# Patient Record
Sex: Female | Born: 1945 | Race: White | Hispanic: No | Marital: Single | Smoking: Never smoker
Health system: Southern US, Community
[De-identification: ages and names within clinical notes are randomized; demographics above are authoritative.]

## PROBLEM LIST (undated history)

## (undated) DIAGNOSIS — H269 Unspecified cataract: Secondary | ICD-10-CM

## (undated) DIAGNOSIS — Z5189 Encounter for other specified aftercare: Secondary | ICD-10-CM

## (undated) DIAGNOSIS — I10 Essential (primary) hypertension: Secondary | ICD-10-CM

## (undated) DIAGNOSIS — F32A Depression, unspecified: Secondary | ICD-10-CM

## (undated) DIAGNOSIS — Z87442 Personal history of urinary calculi: Secondary | ICD-10-CM

## (undated) DIAGNOSIS — T7840XA Allergy, unspecified, initial encounter: Secondary | ICD-10-CM

## (undated) DIAGNOSIS — R011 Cardiac murmur, unspecified: Secondary | ICD-10-CM

## (undated) DIAGNOSIS — F419 Anxiety disorder, unspecified: Secondary | ICD-10-CM

## (undated) DIAGNOSIS — M1990 Unspecified osteoarthritis, unspecified site: Secondary | ICD-10-CM

## (undated) DIAGNOSIS — F329 Major depressive disorder, single episode, unspecified: Secondary | ICD-10-CM

## (undated) DIAGNOSIS — D649 Anemia, unspecified: Secondary | ICD-10-CM

## (undated) DIAGNOSIS — F0781 Postconcussional syndrome: Secondary | ICD-10-CM

## (undated) DIAGNOSIS — M810 Age-related osteoporosis without current pathological fracture: Secondary | ICD-10-CM

## (undated) DIAGNOSIS — J189 Pneumonia, unspecified organism: Secondary | ICD-10-CM

## (undated) DIAGNOSIS — K219 Gastro-esophageal reflux disease without esophagitis: Secondary | ICD-10-CM

## (undated) DIAGNOSIS — J45909 Unspecified asthma, uncomplicated: Secondary | ICD-10-CM

## (undated) HISTORY — DX: Age-related osteoporosis without current pathological fracture: M81.0

## (undated) HISTORY — DX: Major depressive disorder, single episode, unspecified: F32.9

## (undated) HISTORY — DX: Unspecified cataract: H26.9

## (undated) HISTORY — DX: Encounter for other specified aftercare: Z51.89

## (undated) HISTORY — DX: Postconcussional syndrome: F07.81

## (undated) HISTORY — DX: Cardiac murmur, unspecified: R01.1

## (undated) HISTORY — DX: Unspecified asthma, uncomplicated: J45.909

## (undated) HISTORY — DX: Gastro-esophageal reflux disease without esophagitis: K21.9

## (undated) HISTORY — DX: Allergy, unspecified, initial encounter: T78.40XA

## (undated) HISTORY — DX: Unspecified osteoarthritis, unspecified site: M19.90

## (undated) HISTORY — PX: REPLACEMENT TOTAL KNEE BILATERAL: SUR1225

## (undated) HISTORY — DX: Anxiety disorder, unspecified: F41.9

## (undated) HISTORY — DX: Depression, unspecified: F32.A

## (undated) HISTORY — PX: COLONOSCOPY: SHX174

## (undated) HISTORY — DX: Essential (primary) hypertension: I10

---

## 1984-01-31 HISTORY — PX: ABDOMINAL HYSTERECTOMY: SHX81

## 1999-10-15 ENCOUNTER — Encounter: Admission: RE | Admit: 1999-10-15 | Discharge: 1999-10-15 | Payer: Self-pay | Admitting: Orthopedic Surgery

## 1999-10-18 ENCOUNTER — Encounter: Admission: RE | Admit: 1999-10-18 | Discharge: 1999-10-18 | Payer: Self-pay | Admitting: Orthopedic Surgery

## 1999-10-18 ENCOUNTER — Encounter: Payer: Self-pay | Admitting: Orthopedic Surgery

## 1999-10-20 ENCOUNTER — Encounter: Admission: RE | Admit: 1999-10-20 | Discharge: 1999-10-20 | Payer: Self-pay | Admitting: Internal Medicine

## 1999-10-20 ENCOUNTER — Encounter: Payer: Self-pay | Admitting: Internal Medicine

## 2001-11-06 ENCOUNTER — Encounter: Admission: RE | Admit: 2001-11-06 | Discharge: 2001-11-06 | Payer: Self-pay | Admitting: Internal Medicine

## 2001-11-06 ENCOUNTER — Encounter: Payer: Self-pay | Admitting: Internal Medicine

## 2003-02-12 ENCOUNTER — Encounter: Admission: RE | Admit: 2003-02-12 | Discharge: 2003-02-12 | Payer: Self-pay | Admitting: Internal Medicine

## 2003-11-16 ENCOUNTER — Encounter: Admission: RE | Admit: 2003-11-16 | Discharge: 2003-11-16 | Payer: Self-pay | Admitting: Internal Medicine

## 2004-01-12 ENCOUNTER — Encounter: Admission: RE | Admit: 2004-01-12 | Discharge: 2004-01-12 | Payer: Self-pay | Admitting: Internal Medicine

## 2004-12-02 DIAGNOSIS — C439 Malignant melanoma of skin, unspecified: Secondary | ICD-10-CM

## 2004-12-02 HISTORY — DX: Malignant melanoma of skin, unspecified: C43.9

## 2004-12-15 ENCOUNTER — Encounter: Admission: RE | Admit: 2004-12-15 | Discharge: 2004-12-15 | Payer: Self-pay | Admitting: Internal Medicine

## 2005-10-31 DIAGNOSIS — D229 Melanocytic nevi, unspecified: Secondary | ICD-10-CM

## 2005-10-31 HISTORY — DX: Melanocytic nevi, unspecified: D22.9

## 2006-05-30 ENCOUNTER — Encounter: Admission: RE | Admit: 2006-05-30 | Discharge: 2006-05-30 | Payer: Self-pay | Admitting: Internal Medicine

## 2007-01-31 HISTORY — PX: MELANOMA EXCISION: SHX5266

## 2007-11-26 ENCOUNTER — Encounter: Admission: RE | Admit: 2007-11-26 | Discharge: 2007-11-26 | Payer: Self-pay | Admitting: Internal Medicine

## 2009-01-05 ENCOUNTER — Encounter: Admission: RE | Admit: 2009-01-05 | Discharge: 2009-01-05 | Payer: Self-pay | Admitting: Internal Medicine

## 2009-12-02 ENCOUNTER — Encounter: Payer: Self-pay | Admitting: Gastroenterology

## 2010-01-30 DIAGNOSIS — Z5189 Encounter for other specified aftercare: Secondary | ICD-10-CM

## 2010-01-30 HISTORY — DX: Encounter for other specified aftercare: Z51.89

## 2010-02-01 ENCOUNTER — Encounter
Admission: RE | Admit: 2010-02-01 | Discharge: 2010-02-01 | Payer: Self-pay | Source: Home / Self Care | Attending: Internal Medicine | Admitting: Internal Medicine

## 2010-03-01 NOTE — Letter (Signed)
Summary: Colonoscopy Letter  [facility]  [address] [place], [place] [address]   Phone: [phone]  Fax: [phone]      December 02, 2009 MRN: [id number]   [name] [address] [place], [place]  [address]   Dear [name],   According to your medical record, it is time for you to schedule a Colonoscopy. The American Cancer Society recommends this procedure as a method to detect early colon cancer. Patients with a family history of colon cancer, or a personal history of colon polyps or inflammatory bowel disease are at increased risk.  This letter has been generated based on the recommendations made at the time of your procedure. If you feel that in your particular situation this may no longer apply, please contact our office.  Please call our office at [phone] to schedule this appointment or to update your records at your earliest convenience.  Thank you for cooperating with [place] to provide you with the very best care possible.   Sincerely,  Barbette Hair. Arlyce Dice, M.D.  [facility] [phone]

## 2010-05-03 ENCOUNTER — Other Ambulatory Visit (HOSPITAL_COMMUNITY): Payer: Self-pay | Admitting: Orthopedic Surgery

## 2010-05-03 ENCOUNTER — Encounter (HOSPITAL_COMMUNITY)
Admission: RE | Admit: 2010-05-03 | Discharge: 2010-05-03 | Disposition: A | Discharge status: Home / Self Care | Payer: BC Managed Care – PPO | Source: Ambulatory Visit | Attending: Orthopedic Surgery | Admitting: Orthopedic Surgery

## 2010-05-03 ENCOUNTER — Ambulatory Visit (HOSPITAL_COMMUNITY)
Admission: RE | Admit: 2010-05-03 | Discharge: 2010-05-03 | Disposition: A | Discharge status: Home / Self Care | Payer: BC Managed Care – PPO | Source: Ambulatory Visit | Attending: Orthopedic Surgery | Admitting: Orthopedic Surgery

## 2010-05-03 DIAGNOSIS — IMO0002 Reserved for concepts with insufficient information to code with codable children: Secondary | ICD-10-CM | POA: Insufficient documentation

## 2010-05-03 DIAGNOSIS — Z01812 Encounter for preprocedural laboratory examination: Secondary | ICD-10-CM | POA: Insufficient documentation

## 2010-05-03 DIAGNOSIS — K449 Diaphragmatic hernia without obstruction or gangrene: Secondary | ICD-10-CM | POA: Insufficient documentation

## 2010-05-03 DIAGNOSIS — Z01818 Encounter for other preprocedural examination: Secondary | ICD-10-CM | POA: Insufficient documentation

## 2010-05-03 DIAGNOSIS — M1710 Unilateral primary osteoarthritis, unspecified knee: Secondary | ICD-10-CM | POA: Insufficient documentation

## 2010-05-03 DIAGNOSIS — Z01810 Encounter for preprocedural cardiovascular examination: Secondary | ICD-10-CM | POA: Insufficient documentation

## 2010-05-03 LAB — CBC
HCT: 36.2 % (ref 36.0–46.0)
Hemoglobin: 11.7 g/dL — ABNORMAL LOW (ref 12.0–15.0)
MCH: 28.4 pg (ref 26.0–34.0)
MCHC: 32.3 g/dL (ref 30.0–36.0)
MCV: 87.9 fL (ref 78.0–100.0)
Platelets: 287 10*3/uL (ref 150–400)
RBC: 4.12 MIL/uL (ref 3.87–5.11)
RDW: 14.2 % (ref 11.5–15.5)
WBC: 12 10*3/uL — ABNORMAL HIGH (ref 4.0–10.5)

## 2010-05-03 LAB — APTT: aPTT: 32 seconds (ref 24–37)

## 2010-05-03 LAB — URINE MICROSCOPIC-ADD ON

## 2010-05-03 LAB — BASIC METABOLIC PANEL
BUN: 20 mg/dL (ref 6–23)
CO2: 28 mEq/L (ref 19–32)
Calcium: 9.6 mg/dL (ref 8.4–10.5)
Creatinine, Ser: 0.85 mg/dL (ref 0.4–1.2)
GFR calc non Af Amer: >60 mL/min (ref >60)
Glucose, Bld: 96 mg/dL (ref 70–99)
Potassium: 4.4 mEq/L (ref 3.5–5.1)
Sodium: 140 mEq/L (ref 135–145)

## 2010-05-03 LAB — URINALYSIS, ROUTINE W REFLEX MICROSCOPIC
Bilirubin Urine: NEGATIVE
Glucose, UA: NEGATIVE mg/dL
Hgb urine dipstick: NEGATIVE
Ketones, ur: NEGATIVE mg/dL
Nitrite: NEGATIVE
Specific Gravity, Urine: 1.025 (ref 1.005–1.030)
pH: 5.5 (ref 5.0–8.0)

## 2010-05-03 LAB — SURGICAL PCR SCREEN
MRSA, PCR: NEGATIVE
Staphylococcus aureus: POSITIVE — AB

## 2010-05-03 LAB — PROTIME-INR: INR: 1.01 (ref 0.00–1.49)

## 2010-05-11 ENCOUNTER — Inpatient Hospital Stay (HOSPITAL_COMMUNITY): Payer: BC Managed Care – PPO

## 2010-05-11 ENCOUNTER — Inpatient Hospital Stay (HOSPITAL_COMMUNITY)
Admission: RE | Admit: 2010-05-11 | Discharge: 2010-05-14 | DRG: 209 | Disposition: A | Discharge status: Skilled Nursing Facility | Payer: BC Managed Care – PPO | Source: Ambulatory Visit | Attending: Orthopedic Surgery | Admitting: Orthopedic Surgery

## 2010-05-11 DIAGNOSIS — M1710 Unilateral primary osteoarthritis, unspecified knee: Principal | ICD-10-CM | POA: Diagnosis present

## 2010-05-11 DIAGNOSIS — K219 Gastro-esophageal reflux disease without esophagitis: Secondary | ICD-10-CM | POA: Diagnosis present

## 2010-05-11 DIAGNOSIS — F329 Major depressive disorder, single episode, unspecified: Secondary | ICD-10-CM | POA: Diagnosis present

## 2010-05-11 DIAGNOSIS — F3289 Other specified depressive episodes: Secondary | ICD-10-CM | POA: Diagnosis present

## 2010-05-11 DIAGNOSIS — Z85820 Personal history of malignant melanoma of skin: Secondary | ICD-10-CM

## 2010-05-11 DIAGNOSIS — I10 Essential (primary) hypertension: Secondary | ICD-10-CM | POA: Diagnosis present

## 2010-05-11 DIAGNOSIS — J45909 Unspecified asthma, uncomplicated: Secondary | ICD-10-CM | POA: Diagnosis present

## 2010-05-11 DIAGNOSIS — D649 Anemia, unspecified: Secondary | ICD-10-CM | POA: Diagnosis present

## 2010-05-11 DIAGNOSIS — E669 Obesity, unspecified: Secondary | ICD-10-CM | POA: Diagnosis present

## 2010-05-11 LAB — ABO/RH: ABO/RH(D): A POS

## 2010-05-12 LAB — BASIC METABOLIC PANEL
BUN: 10 mg/dL (ref 6–23)
CO2: 28 mEq/L (ref 19–32)
Calcium: 8.2 mg/dL — ABNORMAL LOW (ref 8.4–10.5)
Chloride: 106 mEq/L (ref 96–112)
Creatinine, Ser: 0.91 mg/dL (ref 0.4–1.2)
GFR calc Af Amer: >60 mL/min (ref >60)
Glucose, Bld: 123 mg/dL — ABNORMAL HIGH (ref 70–99)
Potassium: 5 mEq/L (ref 3.5–5.1)
Sodium: 138 mEq/L (ref 135–145)

## 2010-05-12 LAB — HEMOGLOBIN AND HEMATOCRIT, BLOOD
HCT: 26.6 % — ABNORMAL LOW (ref 36.0–46.0)
Hemoglobin: 8.5 g/dL — ABNORMAL LOW (ref 12.0–15.0)

## 2010-05-12 LAB — CBC
Hemoglobin: 8.9 g/dL — ABNORMAL LOW (ref 12.0–15.0)
MCH: 29.2 pg (ref 26.0–34.0)
MCHC: 32.6 g/dL (ref 30.0–36.0)
Platelets: 212 10*3/uL (ref 150–400)
RBC: 3.05 MIL/uL — ABNORMAL LOW (ref 3.87–5.11)
RDW: 14.5 % (ref 11.5–15.5)
WBC: 9.9 10*3/uL (ref 4.0–10.5)

## 2010-05-12 LAB — PROTIME-INR
INR: 1.18 (ref 0.00–1.49)
Prothrombin Time: 15.2 seconds (ref 11.6–15.2)

## 2010-05-13 ENCOUNTER — Inpatient Hospital Stay (HOSPITAL_COMMUNITY): Payer: BC Managed Care – PPO

## 2010-05-13 LAB — CBC
Hemoglobin: 8.1 g/dL — ABNORMAL LOW (ref 12.0–15.0)
MCHC: 32.4 g/dL (ref 30.0–36.0)
Platelets: 189 10*3/uL (ref 150–400)
RBC: 2.85 MIL/uL — ABNORMAL LOW (ref 3.87–5.11)
RDW: 14.4 % (ref 11.5–15.5)
WBC: 11.2 10*3/uL — ABNORMAL HIGH (ref 4.0–10.5)

## 2010-05-13 LAB — BASIC METABOLIC PANEL
BUN: 8 mg/dL (ref 6–23)
Calcium: 8 mg/dL — ABNORMAL LOW (ref 8.4–10.5)
Chloride: 103 mEq/L (ref 96–112)
Creatinine, Ser: 0.87 mg/dL (ref 0.4–1.2)
GFR calc Af Amer: >60 mL/min (ref >60)
GFR calc non Af Amer: >60 mL/min (ref >60)
Potassium: 4 mEq/L (ref 3.5–5.1)
Sodium: 135 mEq/L (ref 135–145)

## 2010-05-13 LAB — HEMOGLOBIN AND HEMATOCRIT, BLOOD
HCT: 24.8 % — ABNORMAL LOW (ref 36.0–46.0)
Hemoglobin: 8 g/dL — ABNORMAL LOW (ref 12.0–15.0)

## 2010-05-13 LAB — PROTIME-INR
INR: 2.34 — ABNORMAL HIGH (ref 0.00–1.49)
Prothrombin Time: 25.8 seconds — ABNORMAL HIGH (ref 11.6–15.2)

## 2010-05-13 LAB — PREPARE RBC (CROSSMATCH)

## 2010-05-14 DIAGNOSIS — M79609 Pain in unspecified limb: Secondary | ICD-10-CM

## 2010-05-14 LAB — BASIC METABOLIC PANEL
BUN: 9 mg/dL (ref 6–23)
Creatinine, Ser: 0.81 mg/dL (ref 0.4–1.2)
GFR calc non Af Amer: >60 mL/min (ref >60)
Glucose, Bld: 115 mg/dL — ABNORMAL HIGH (ref 70–99)
Potassium: 4.1 mEq/L (ref 3.5–5.1)

## 2010-05-14 LAB — CBC
Hemoglobin: 10.3 g/dL — ABNORMAL LOW (ref 12.0–15.0)
MCH: 28.1 pg (ref 26.0–34.0)
MCHC: 33.1 g/dL (ref 30.0–36.0)
Platelets: 213 10*3/uL (ref 150–400)
RDW: 14.7 % (ref 11.5–15.5)

## 2010-05-14 LAB — TYPE AND SCREEN
ABO/RH(D): A POS
Unit division: 0
Unit division: 0

## 2010-05-14 LAB — PROTIME-INR
INR: 2.69 — ABNORMAL HIGH (ref 0.00–1.49)
Prothrombin Time: 28.7 seconds — ABNORMAL HIGH (ref 11.6–15.2)

## 2010-06-07 NOTE — Discharge Summary (Signed)
[name], [name] NO.:  [id number]  MEDICAL RECORD NO.:  [id number]           PATIENT TYPE:  I  LOCATION:  5024                         FACILITY:  [facility]  PHYSICIAN:  Loreta Ave, M.D. DATE OF BIRTH:  [birth year]  DATE OF ADMISSION:  05/11/2010 DATE OF DISCHARGE:  05/14/2010                              DISCHARGE SUMMARY   FINAL DIAGNOSES: 1. 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 2. Hypertension. 3. Gastroesophageal reflux disease. 4. Anemia. 5. Depression. 6. Asthma.  HISTORY OF PRESENT ILLNESS:  An 65 year old white female with history of end-stage DJD, left knee and chronic pain presented to our office for preop evaluation for total knee replacement.  She had progressively worsening pain, which failed to respond with conservative treatment. Significant decreased in her daily activities due to the ongoing complaint.  HOSPITAL COURSE:  On May 11, 2010, the patient was taken to the [facility] OR and a left total knee replacement procedure performed.  SURGEON:  Loreta Ave, MD  ASSISTANT:  Genene Churn. Barry Dienes, PA-C  ANESTHESIA:  General.  Femoral nerve block.  EBL:  Minimal.  TOURNIQUET TIME:  71 minutes.  One Hemovac drain used.  There were no surgical or anesthesia complications and the patient was transferred to recovery in stable condition.  After the patient was transferred to the orthopedic floor, pharmacy protocol of Coumadin and Lovenox started for DVT prophylaxis. On May 12, 2010, the patient is doing well with good pain control. Hemoglobin 8.9, hematocrit 27.3, WBC 9.9, platelets 212.  Sodium 138, potassium 4.0, chloride 106, CO2 of 28, BUN 10, creatinine 0.91, glucose 123.  INR 1.12.  Dressing clean, dry, and intact.  Calf nontender neurovascularly.  Skin warm and dry.  Discontinue the PCA.  Arranging transfer to [facility] for rehab.  Encourage incentive spirometry. Recheck  H and H.  On May 13, 2010, the patient doing well again with good pain control left knee.  Hemoglobin 8.1, hematocrit 25.0.  INR 2.34.  The wound looks good and staples intact.  No drainage or sign of infection.  Bilateral calves some tenderness.  Hemovac drain removed. Rechecked hemoglobin later in the afternoon and this was 8.  Would type and cross and transfused 2 units of packed red blood cells.  Awaiting transfer to [facility] Saturday morning.  DISCHARGE MEDICATIONS: 1. Percocet 7.5/325 one to two tablets p.o. q.4-6 h. p.r.n. for pain. 2. Robaxin 500 mg 1 tablet p.o. q.6 h. p.r.n. spasms. 3. Coumadin pharmacy protocol.  Maintain INR 2-3 x4 weeks, postop DVT     prophylaxis. 4. Iron tablet, 1 tablet daily. 5. Flonase nasal spray one spray each nostril daily as needed. 6. Albuterol inhaler 1 puff daily as needed. 7. Serevent inhaler 1 puff daily as needed. 8. EpiPen subcu one injection daily p.r.n. 9. Xanax 0.25 mg p.o. 1 tablet q.8 h. p.r.n. anxiety. 10.Omeprazole 20 mg 1 tablet p.o. daily. 11.Fexofenadine 180 mg 1 tablet p.o. daily. 12.Fluoxetine 40 mg 1 tablet p.o. daily. 13.Doxazosin 8 mg 1 tablet p.o. daily 14.Norvasc 10 mg 1 tablet p.o. daily 15.Atenolol 100 mg 1  tablet p.o. daily.  CONDITION:  Good and stable.  DISPOSITION:  Transferred to [facility] for rehab.  INSTRUCTIONS:  While at [facility], the patient will continue to work with PT and OT to improve ambulation and knee range of motion and strengthening.  She is weightbearing as tolerated.  CPM 0 to 75 degrees and increase by 5-10 degrees daily as tolerated.  She is okay to shower, but no tub soaking.  Daily dressing changes with 4x4 gauze and can pull TED hose over this.  Coumadin x4 weeks postop for DVT prophylaxis. Maintain INR 2-3.  Needs return office visit with Dr. Eulah Pont when she is 2 weeks postop for recheck and we will remove staples at that time. Notify us immediately if there are any questions or  concerns.     Genene Churn. Denton Meek.   ______________________________ Loreta Ave, M.D.    JMO/MEDQ  D:  05/13/2010  T:  05/13/2010  Job:  [id number]  Electronically Signed by Zonia Kief P.A. on 05/18/2010 03:34:11 PM Electronically Signed by Mckinley Jewel M.D. on 06/07/2010 01:29:55 PM

## 2010-06-07 NOTE — Op Note (Signed)
[name], [name] NO.:  [id number]  MEDICAL RECORD NO.:  [id number]           PATIENT TYPE:  I  LOCATION:  5024                         FACILITY:  [facility]  PHYSICIAN:  Loreta Ave, M.D. DATE OF BIRTH:  27-Jul-1945  DATE OF PROCEDURE:  05/11/2010 DATE OF DISCHARGE:                              OPERATIVE REPORT   PREOPERATIVE DIAGNOSIS:  Left knee end-stage degenerative arthritis, varus alignment.  POSTOPERATIVE DIAGNOSIS:  Left knee end-stage degenerative arthritis, varus alignment.  PROCEDURE:  Left modified minimally invasive total knee replacement Stryker triathlon prosthesis.  Soft tissue balancing.  Cemented pegged posterior stabilized #3 femoral component.  Cemented #4 tibial component.  9-mm polyethylene insert.  Cemented resurfacing 32-mm patellar component.  SURGEON:  Loreta Ave, MD  ASSISTANT:  Zonia Kief, PA, present throughout the entire case and necessary for timely completion of procedure.  ANESTHESIA:  General.  ESTIMATED BLOOD LOSS:  Minimal.  SPECIMENS:  None.  COUNTS:  None.  COMPLICATIONS:  None.  DRESSING:  Soft compressive, knee immobilizer.  DRAINS:  Hemovac x1.  TOURNIQUET TIME:  1 hour.  PROCEDURE IN DETAIL:  The patient was brought to the operating room and placed on the operating table in supine position.  After adequate anesthesia had been obtained, left knee examined.  Mild flexion contracture, varus alignment partially correctable flexion 90 degrees. Tourniquet applied.  Prepped and draped in usual sterile fashion. Exsanguinated with elevation, Esmarch and tourniquet inflated at 350 mmHg.  Straight incision above the patella down to the tibial tubercle. Medial arthrotomy vastus splitting preserving quad tendon.  Medial capsule released.  Knee exposed.  Grade 4 changes throughout.  Remnants of menisci, cruciate ligaments, periarticular spurs, and loose bodies removed.  Distal femur exposed.   Intramedullary guide placed.  10-mm resection set at 5 degrees of valgus.  Using epicondylar axis, the femur was sized, cut, and fitted for posterior stabilized pegged #3 femoral component.  Proximal tibial resection.  Extramedullary guide.  3-degree posterior slope cut.  Size #4 component.  All recesses explored.  All loose bodies, fragments, and spurs removed.  Copious irrigation. Patella exposed.  Posterior 10 mm removed.  Drilled, sized, and fitted for a 32-mm component.  Trials put in place throughout.  #3 above, #4 below, and a 9-mm insert.  A 32-mm component on the patella.  With this construct, full extension, full flexion, good alignment, good stability, and good mechanical axis.  Tibia was marked for rotation and hand reamed.  All trials have been removed.  Copious irrigation with a pulse irrigating device.  Cement prepared and placed on all components which were firmly seated.  Polyethylene attached to tibia and knee reduced. Patella held with clamp.  After the cement hardened, the knee was reexamined.  Again, pleased with alignment, stability, mechanical axis, and tracking.  Hemovac was placed through a separate stab wound.  Wound irrigated.  Arthrotomy closed with #1 Vicryl.  Skin and subcutaneous tissue with Vicryl and staples.  Hemovac clamp.  Sterile compressive dressing applied.  Tourniquet deflated and removed.  Knee immobilizer applied.  Anesthesia reversed.  Brought to recovery room.  Tolerated the surgery well.  No  complications.     Loreta Ave, M.D.     DFM/MEDQ  D:  05/11/2010  T:  05/12/2010  Job:  [id number]  Electronically Signed by Mckinley Jewel M.D. on 06/07/2010 01:29:59 PM

## 2010-06-10 ENCOUNTER — Encounter (HOSPITAL_COMMUNITY)
Admission: RE | Admit: 2010-06-10 | Discharge: 2010-06-10 | Disposition: A | Discharge status: Home / Self Care | Payer: BC Managed Care – PPO | Source: Ambulatory Visit | Attending: Orthopedic Surgery | Admitting: Orthopedic Surgery

## 2010-06-10 LAB — CBC
HCT: 41.7 % (ref 36.0–46.0)
MCHC: 31.7 g/dL (ref 30.0–36.0)
MCV: 88 fL (ref 78.0–100.0)
Platelets: 307 10*3/uL (ref 150–400)
RDW: 13.8 % (ref 11.5–15.5)

## 2010-06-10 LAB — COMPREHENSIVE METABOLIC PANEL
ALT: 17 U/L (ref 0–35)
AST: 22 U/L (ref 0–37)
Albumin: 3.8 g/dL (ref 3.5–5.2)
Calcium: 10.6 mg/dL — ABNORMAL HIGH (ref 8.4–10.5)
GFR calc Af Amer: >60 mL/min (ref >60)
Sodium: 141 mEq/L (ref 135–145)
Total Protein: 7.6 g/dL (ref 6.0–8.3)

## 2010-06-10 LAB — URINE MICROSCOPIC-ADD ON

## 2010-06-10 LAB — URINALYSIS, ROUTINE W REFLEX MICROSCOPIC
Bilirubin Urine: NEGATIVE
Hgb urine dipstick: NEGATIVE
Ketones, ur: 15 mg/dL — AB
Specific Gravity, Urine: 1.026 (ref 1.005–1.030)
pH: 5 (ref 5.0–8.0)

## 2010-06-10 LAB — PROTIME-INR: INR: 1.64 — ABNORMAL HIGH (ref 0.00–1.49)

## 2010-06-22 ENCOUNTER — Inpatient Hospital Stay (HOSPITAL_COMMUNITY): Payer: BC Managed Care – PPO

## 2010-06-22 ENCOUNTER — Inpatient Hospital Stay (HOSPITAL_COMMUNITY)
Admission: RE | Admit: 2010-06-22 | Discharge: 2010-06-25 | DRG: 209 | Disposition: A | Discharge status: Skilled Nursing Facility | Payer: BC Managed Care – PPO | Source: Ambulatory Visit | Attending: Orthopedic Surgery | Admitting: Orthopedic Surgery

## 2010-06-22 DIAGNOSIS — F329 Major depressive disorder, single episode, unspecified: Secondary | ICD-10-CM | POA: Diagnosis present

## 2010-06-22 DIAGNOSIS — Z85820 Personal history of malignant melanoma of skin: Secondary | ICD-10-CM

## 2010-06-22 DIAGNOSIS — K219 Gastro-esophageal reflux disease without esophagitis: Secondary | ICD-10-CM | POA: Diagnosis present

## 2010-06-22 DIAGNOSIS — F3289 Other specified depressive episodes: Secondary | ICD-10-CM | POA: Diagnosis present

## 2010-06-22 DIAGNOSIS — I10 Essential (primary) hypertension: Secondary | ICD-10-CM | POA: Diagnosis present

## 2010-06-22 DIAGNOSIS — D62 Acute posthemorrhagic anemia: Secondary | ICD-10-CM | POA: Diagnosis not present

## 2010-06-22 DIAGNOSIS — J45909 Unspecified asthma, uncomplicated: Secondary | ICD-10-CM | POA: Diagnosis present

## 2010-06-22 DIAGNOSIS — G8929 Other chronic pain: Secondary | ICD-10-CM | POA: Diagnosis present

## 2010-06-22 DIAGNOSIS — M1710 Unilateral primary osteoarthritis, unspecified knee: Principal | ICD-10-CM | POA: Diagnosis present

## 2010-06-22 LAB — URINALYSIS, ROUTINE W REFLEX MICROSCOPIC
Ketones, ur: 15 mg/dL — AB
Nitrite: NEGATIVE
Specific Gravity, Urine: 1.026 (ref 1.005–1.030)
pH: 5.5 (ref 5.0–8.0)

## 2010-06-22 LAB — PROTIME-INR: INR: 1 (ref 0.00–1.49)

## 2010-06-22 LAB — URINE MICROSCOPIC-ADD ON

## 2010-06-23 LAB — PROTIME-INR: INR: 1.3 (ref 0.00–1.49)

## 2010-06-23 LAB — CBC
HCT: 26.8 % — ABNORMAL LOW (ref 36.0–46.0)
Platelets: 242 10*3/uL (ref 150–400)
RDW: 14.1 % (ref 11.5–15.5)
WBC: 11.8 10*3/uL — ABNORMAL HIGH (ref 4.0–10.5)

## 2010-06-23 LAB — BASIC METABOLIC PANEL
Calcium: 8.8 mg/dL (ref 8.4–10.5)
Creatinine, Ser: 0.66 mg/dL (ref 0.4–1.2)
GFR calc Af Amer: >60 mL/min (ref >60)
GFR calc non Af Amer: >60 mL/min (ref >60)

## 2010-06-23 LAB — URINE CULTURE

## 2010-06-24 LAB — CBC
HCT: 24.8 % — ABNORMAL LOW (ref 36.0–46.0)
Hemoglobin: 8 g/dL — ABNORMAL LOW (ref 12.0–15.0)
MCH: 28.4 pg (ref 26.0–34.0)
MCHC: 32.3 g/dL (ref 30.0–36.0)

## 2010-06-24 LAB — BASIC METABOLIC PANEL
BUN: 9 mg/dL (ref 6–23)
Chloride: 105 mEq/L (ref 96–112)
Glucose, Bld: 132 mg/dL — ABNORMAL HIGH (ref 70–99)
Potassium: 4.1 mEq/L (ref 3.5–5.1)

## 2010-06-25 LAB — BASIC METABOLIC PANEL
BUN: 9 mg/dL (ref 6–23)
CO2: 28 mEq/L (ref 19–32)
Chloride: 104 mEq/L (ref 96–112)
Glucose, Bld: 108 mg/dL — ABNORMAL HIGH (ref 70–99)
Potassium: 4.8 mEq/L (ref 3.5–5.1)

## 2010-06-25 LAB — TYPE AND SCREEN
ABO/RH(D): A POS
Unit division: 0

## 2010-06-25 LAB — CBC
HCT: 30.2 % — ABNORMAL LOW (ref 36.0–46.0)
Hemoglobin: 9.9 g/dL — ABNORMAL LOW (ref 12.0–15.0)
MCH: 28.9 pg (ref 26.0–34.0)
MCHC: 32.8 g/dL (ref 30.0–36.0)
MCV: 88.3 fL (ref 78.0–100.0)

## 2010-06-26 NOTE — Op Note (Signed)
NAMECARYSSA, [name] NO.:  [id number]  MEDICAL RECORD NO.:  [id number]           PATIENT TYPE:  I  LOCATION:  5003                         FACILITY:  [facility]  PHYSICIAN:  Loreta Ave, M.D. DATE OF BIRTH:  1945-10-14  DATE OF PROCEDURE:  06/22/2010 DATE OF DISCHARGE:                              OPERATIVE REPORT   PREOPERATIVE DIAGNOSIS:  End-stage degenerative arthritis, right knee, varus alignment.  POSTOPERATIVE DIAGNOSIS:  End-stage degenerative arthritis, right knee, varus alignment.  PROCEDURE:  Modified minimally invasive right total knee replacement with Stryker triathlon prosthesis.  Soft tissue balancing.  Cemented pegged posterior stabilized #3 femoral component.  Cemented #4 tibial component 9-mm polyethylene insert.  Cemented 32-mm patellar component.  SURGEON:  Loreta Ave, MD  ASSISTANT:  Zonia Kief, PA, present throughout the entire case and necessary for timely completion of procedure.  ANESTHESIA:  General.  ESTIMATED BLOOD LOSS:  Minimal.  SPECIMENS:  None.  COUNTS:  None.  COMPLICATIONS:  None.  DRESSING:  Soft compressive.  TOURNIQUET TIME:  1 hour.  PROCEDURE IN DETAIL:  The patient was brought to the operating room and placed on the operating table in supine position.  After adequate anesthesia had been obtained, knee examined.  Varus alignment just prior to correctable neutral.  Motion 0-100.  Tourniquet applied.  Prepped and draped in the usual sterile fashion.  Exsanguinated with elevation of Esmarch.  Tourniquet inflated to 350 mmHg.  Anterior incision above the patella down to tibial tubercle.  Medial arthrotomy vastus splitting. Knee exposed.  Grade 4 change throughout most marked medially.  Medial capsule release.  Remnants of menisci, cruciate ligament spurs, and loose bodies removed.  Intramedullary guide on the femur.  10-mm resection set at 5 degrees of valgus.  The femur was sized, cut, and fitted  for a posterior stabilized #3 component using epicondylar axis. Extramedullary guide on the tibia, 3-degree posterior slope cut.  Sized to #4 component.  After irrigating out the entire knee and checking all recess to be sure all debris was cleared out, trials put in place.  #3 on the femur, #4 on the tibia.  9-mm insert.  Patella exposed, posterior 10 mm removed.  Drilled, sized, and fitted for a 32-mm component.  With all these trials in place, I was very pleased with mechanical axis, alignment, stability, and patellofemoral tracking.  Tibia was marked for rotation and hand reamed.  All trials had been removed.  Copious irrigation with pulse irrigating device.  Cement prepared and placed on all components which were firmly seated.  Polyethylene attached to tibia and knee reduced.  Patella held with a clamp.  Once the cement hardened, I again noted motion, stability, and tracking and was pleased.  Wound irrigated.  Hemovac placed and brought through a separate stab wound. Arthrotomy closed with #1 Vicryl.  Skin and subcutaneous tissue with Vicryl and staples.  Sterile compressive dressing applied.  Tourniquet deflated and removed.  Knee immobilizer applied.  Anesthesia reversed. Brought to recovery room.  Tolerated surgery well.  No complications.     Loreta Ave, M.D.     DFM/MEDQ  D:  06/22/2010  T:  06/23/2010  Job:  [id number]  Electronically Signed by Mckinley Jewel M.D. on 06/26/2010 11:38:23 AM

## 2010-07-21 NOTE — Discharge Summary (Signed)
[name], [name] NO.:  [id number]  MEDICAL RECORD NO.:  [id number]           PATIENT TYPE:  I  LOCATION:  5003                         FACILITY:  [facility]  PHYSICIAN:  Loreta Ave, M.D. DATE OF BIRTH:  08-Aug-1945  DATE OF ADMISSION:  06/22/2010 DATE OF DISCHARGE:  06/24/2010                              DISCHARGE SUMMARY   FINAL DIAGNOSES: 1. Status post right total knee replacement for end-stage degenerative     joint disease. 2. Anemia. 3. Hypertension. 4. Gastroesophageal reflux disease. 5. Depression. 6. Asthma.  HISTORY OF PRESENT ILLNESS:  A 65 year old white female, history of end- stage DJD, right knee and chronic pain, presented to our office for preoperative evaluation for total knee replacement.  She had progressive worsening pain with failure to response with conservative treatment. Significant decrease in her daily activities due to the ongoing complaint.HOSPITAL COURSE:  On Jun 22, 2010, the patient was taken to the [facility] OR and a right total knee replacement procedure was performed. Surgeon Mckinley Jewel, MD and assistant Zonia Kief, PA-C.  Anesthesia general.  Minimal blood loss.  No specimens.  Tourniquet time 1 hour. There were no surgical or anesthesia complications.  The patient was transferred to recovery in stable condition.  On Jun 23, 2010, the patient was doing well.  Good pain control.  Pharmacy protocol Coumadin, Lovenox started for DVT prophylaxis.  Temperature 98.3, pulse 69, respirations 18, blood pressure 93/45.  WBC 11.8, hemoglobin 8.8, hematocrit 26.8, platelets 242.  Sodium 136, potassium 4.1, chloride 102, CO2 of 27, glucose 116, BUN 12, creatinine 0.66, INR 1.30.  Urine culture showed no growth.  PT/OT consults.  Awaiting placement at [facility] for rehab.  On Jun 24, 2010, the patient is doing well.  He has complained of right knee pain.  Requesting stronger pain meds. Temperature 98.2, pulse 79,  respirations 18, blood pressure 125/48.  WBC 10.3, hemoglobin 8.0, hematocrit 24.8, platelets 211.  Sodium 137, potassium 4.1, chloride 105, CO2 of 25, glucose 132, BUN 9, creatinine 0.67, INR 1.44.  Right knee wound looks good and staples intact.  No drainage or sign of infection.  Hemovac drain removed.  Calf nontender and neurovascularly intact.  Skin warm and dry.  We will transfuse 2 units of packed red blood cells for acute blood loss anemia.  Anticipate transfer to [facility] Saturday morning.  DISCHARGE MEDICATIONS: 1. Percocet 10/325 one to two tablets daily q.6 h. p.r.n. for pain. 2. Robaxin 500 mg 1 tablet p.o. q.6 h. p.r.n. for spasms. 3. Coumadin pharmacy protocol.  Maintain INR 2-3 times 4 weeks postop     for DVT prophylaxis. 4. Lovenox 30 mg 1 subcu injection q.12 h. and discontinue when     Coumadin is therapeutic. 5. Flonase nasal spray 1 spray each nostril daily as needed. 6. Albuterol inhaler 1 puff daily as needed. 7. Serevent inhaler 1 puff daily as needed. 8. EpiPen subcu 1 injection daily p.r.n. 9. Xanax 0.25 mg 1 tablet p.o. q.8 h. p.r.n. for anxiety. 10.Omeprazole 20 mg 1 tablet p.o. daily. 11.Fexofenadine 180 mg 1 tablet p.o. daily. 12.Fluoxetine 40 mg 1 tablet p.o. daily.  13.Doxazosin 8 mg 1 tablet p.o. daily. 14.Norvasc 10 mg 1 tablet p.o. daily. 15.Atenolol 100 mg 1 tablet p.o. daily.  CONDITION:  Good and stable.  DISPOSITION:  Transfer to [facility] for rehab.  INSTRUCTIONS:  While at [facility], the patient will continue to work with PT and OT to improve ambulation and knee range of motion and strengthening.  Weightbear as tolerated.  Coumadin x4 weeks postop for DVT prophylaxis.  Discontinue Lovenox when the Coumadin is therapeutic with INR 2-3.  Daily dressing changes with 4 x 4 gauze and apply a TED hose over this.  Knee staples to be removed 2 weeks postop in our office.  The patient okay to shower, but no tub soaking.  Do not  apply any creams or ointments to her incision.  Follow up with Dr. Eulah Pont when she is 2 weeks postop for recheck.  Call us immediately if there are any questions or concerns.     Genene Churn. Denton Meek.   ______________________________ Loreta Ave, M.D.    JMO/MEDQ  D:  06/24/2010  T:  06/24/2010  Job:  [id number]  Electronically Signed by Zonia Kief P.A. on 06/29/2010 03:22:45 PM Electronically Signed by Mckinley Jewel M.D. on 07/21/2010 04:05:55 PM

## 2010-07-23 ENCOUNTER — Inpatient Hospital Stay (HOSPITAL_COMMUNITY)
Admission: RE | Admit: 2010-07-23 | Discharge: 2010-07-25 | DRG: 430 | Disposition: A | Discharge status: Home / Self Care | Payer: BC Managed Care – PPO | Source: Ambulatory Visit | Attending: Psychiatry | Admitting: Psychiatry

## 2010-07-23 ENCOUNTER — Emergency Department (HOSPITAL_COMMUNITY)
Admission: EM | Admit: 2010-07-23 | Discharge: 2010-07-23 | Disposition: A | Discharge status: Other Acute Inpatient Hospital | Payer: BC Managed Care – PPO | Attending: Emergency Medicine | Admitting: Emergency Medicine

## 2010-07-23 DIAGNOSIS — Z7901 Long term (current) use of anticoagulants: Secondary | ICD-10-CM

## 2010-07-23 DIAGNOSIS — F341 Dysthymic disorder: Secondary | ICD-10-CM | POA: Insufficient documentation

## 2010-07-23 DIAGNOSIS — Z96659 Presence of unspecified artificial knee joint: Secondary | ICD-10-CM

## 2010-07-23 DIAGNOSIS — J45909 Unspecified asthma, uncomplicated: Secondary | ICD-10-CM

## 2010-07-23 DIAGNOSIS — I10 Essential (primary) hypertension: Secondary | ICD-10-CM

## 2010-07-23 DIAGNOSIS — Z79899 Other long term (current) drug therapy: Secondary | ICD-10-CM | POA: Insufficient documentation

## 2010-07-23 DIAGNOSIS — R45851 Suicidal ideations: Secondary | ICD-10-CM

## 2010-07-23 DIAGNOSIS — R110 Nausea: Secondary | ICD-10-CM | POA: Insufficient documentation

## 2010-07-23 DIAGNOSIS — R002 Palpitations: Secondary | ICD-10-CM | POA: Insufficient documentation

## 2010-07-23 DIAGNOSIS — R4589 Other symptoms and signs involving emotional state: Secondary | ICD-10-CM | POA: Insufficient documentation

## 2010-07-23 DIAGNOSIS — D649 Anemia, unspecified: Secondary | ICD-10-CM

## 2010-07-23 DIAGNOSIS — F332 Major depressive disorder, recurrent severe without psychotic features: Principal | ICD-10-CM

## 2010-07-23 DIAGNOSIS — M25569 Pain in unspecified knee: Secondary | ICD-10-CM | POA: Insufficient documentation

## 2010-07-23 DIAGNOSIS — K219 Gastro-esophageal reflux disease without esophagitis: Secondary | ICD-10-CM

## 2010-07-23 LAB — POCT I-STAT, CHEM 8
BUN: 17 mg/dL (ref 6–23)
Calcium, Ion: 1.2 mmol/L (ref 1.12–1.32)
Creatinine, Ser: 0.7 mg/dL (ref 0.50–1.10)
Sodium: 140 mEq/L (ref 135–145)
TCO2: 23 mmol/L (ref 0–100)

## 2010-07-23 LAB — DIFFERENTIAL
Basophils Relative: 0 % (ref 0–1)
Monocytes Absolute: 1 10*3/uL (ref 0.1–1.0)
Monocytes Relative: 10 % (ref 3–12)
Neutro Abs: 6.2 10*3/uL (ref 1.7–7.7)

## 2010-07-23 LAB — URINALYSIS, ROUTINE W REFLEX MICROSCOPIC
Glucose, UA: NEGATIVE mg/dL
Ketones, ur: 15 mg/dL — AB
Specific Gravity, Urine: 1.024 (ref 1.005–1.030)
pH: 5.5 (ref 5.0–8.0)

## 2010-07-23 LAB — RAPID URINE DRUG SCREEN, HOSP PERFORMED
Cocaine: NOT DETECTED
Opiates: POSITIVE — AB

## 2010-07-23 LAB — URINE MICROSCOPIC-ADD ON

## 2010-07-23 LAB — ETHANOL: Alcohol, Ethyl (B): <11 mg/dL (ref 0–11)

## 2010-07-23 LAB — CBC
Hemoglobin: 12.4 g/dL (ref 12.0–15.0)
MCH: 28.7 pg (ref 26.0–34.0)
MCHC: 33 g/dL (ref 30.0–36.0)

## 2010-07-24 DIAGNOSIS — F329 Major depressive disorder, single episode, unspecified: Secondary | ICD-10-CM

## 2010-07-26 NOTE — Discharge Summary (Signed)
[name], [name] NO.:  [id number]  MEDICAL RECORD NO.:  [id number]  LOCATION:  1610                          FACILITY:  [facility]  PHYSICIAN:  Debbora Lacrosse, MD       DATE OF BIRTH:  May 06, 1945  DATE OF ADMISSION:  07/23/2010 DATE OF DISCHARGE:  07/25/2010                              DISCHARGE SUMMARY   REASON FOR ADMISSION:  This is a 65 year old divorced white female who had a difficult time with significant stressors recently.  She has had significant depression and anxiety, and stated that she just wants to sleep.  She has recently had both knees replaced and had a difficult time with the second one in May.  She also, just last week, was notified that her brother had died and this was just after having a significant stroke.  She stated initially she wanted to drink a bottle of wine and slam her head into a wall, but she stated she never really wanted to end her life.  She has been seeing her primary care physician, Dr. Chilton Si, for depression.  She has not been eating or sleeping well recently.  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 AXIS II:  None. AXIS III: 1. Status post bilateral knee replacements. 2. History of anemia. 3. History of hypertension. 4. History of gastroesophageal reflux disease. 5. Asthma. AXIS IV:  Problems with support, social environment and bereavement. AXIS V:  50.  PERTINENT LABORATORY DATA:  Urine drug screen positive for benzodiazepines and opiates, as expected from her medications.  PTT 25, PT 17.3.  Alcohol less than 11.  CBC normal.  STAT Chem-8 normal, except the glucose was 158.  HOSPITAL COURSE:  The patient was admitted on July 23, 2010, and was not suicidal at that time.  She has been dealing with significant stressors as noted in the history and has returned to what she believes is her usual baseline.  She did have some changes in medication, including  a change from Xanax to Klonopin, which she tolerated well and she also had addition of Remeron 15 mg q.h.s., which she reports has been helpful for her sleep and appetite.  She feels more energized, more focused and more hopeful.  She has a supportive family and her daughter is a Visual merchandiser, who also believes that the patient is safe and stable. The patient is not suicidal or homicidal and no psychotic symptoms.  The patient is safe for discharge.  The patient participated well in the inpatient unit in individual and group treatment.  She continued on her nonpsychiatric medications and will continue those at discharge.  DISCHARGE MEDICATIONS: 1. Atenolol 100 mg daily. 2. Norvasc 10 mg daily. 3. Cardura 8 mg daily. 4. Prozac 40 mg daily. 5. Claritin 10 mg daily. 6. Protonix 40 mg daily. 7. Serevent one puff daily. 8. Remeron 15 mg q.h.s. 9. Klonopin 0.25 mg q.i.d. 10.Cipro 250 mg b.i.d. until July 26, 2010.  DISCHARGE FOLLOWUP:  The patient will continue to follow up with her primary care physician who has been managing her psychiatric and nonpsychiatric medications.  She has a support team with her family and if  further therapy or treatment options are required, those can be arranged.  CONDITION ON DISCHARGE:  The patient is stable, not dangerous, not suicidal or homicidal and is able to safely be maintained at home on her own.          ______________________________ Debbora Lacrosse, MD     WS/MEDQ  D:  07/25/2010  T:  07/25/2010  Job:  [id number]  Electronically Signed by Andi Devon Victorino Fatzinger  on 07/26/2010 08:14:32 AM

## 2010-07-26 NOTE — H&P (Signed)
[name], [name] NO.:  [id number]  MEDICAL RECORD NO.:  [id number]  LOCATION:  0307                          FACILITY:  [facility]  PHYSICIAN:  Erica Lacrosse, MD       DATE OF BIRTH:  [birth year]  DATE OF ADMISSION:  07/23/2010 DATE OF DISCHARGE:                      PSYCHIATRIC ADMISSION ASSESSMENT   This is a psychiatric admission assessment regarding the patient, [name].  This is a voluntary admission to the services of Dr. Judithann Patel.  Today's date is July 24, 2010.  This is a 65 year old divorced white female.  Apparently she "lost it" in [name]'s yesterday.  She stated "I've gone through so much depression and anxiety all I want to do is sleep".  She has had both knees replaced, the first one was April 11, the second was May 23 and she just finished with home health care on Friday.  She was notified that her brother died earlier in the day, 2022/07/01, and she "lost it".  She reported suicidal ideation of either wanting to drink a bottle of wine or slam her head into a wall.  She said "I'm tired of feeling like this; all I wanted to do was get my knee surgery and be able to get around again; there is too much going on in my family and I need to be here for them".  Apparently she has been treated for many years on the outside by her primary care physician, Dr. Nila Patel, for depression.  She was the older sister of the brother who died.  Apparently he had quite an alcohol issue.  He had a stroke. He was not found for several days.  He was put on hospice and he did pass yesterday.  The patient states that she did go to the health food store to try to find something to sleep.  She has not been sleeping well and her appetite is off.  She threatened to slam her head through the wall yesterday to get the "pain" to stop.  This would be the chest pain and headache that she had after hearing that her brother had actually passed.  PAST PSYCHIATRIC HISTORY:   She does not have a formal history.  SOCIAL HISTORY:  She finished high school.  She has been married and divorced once.  She has a daughter 32 who is a Child psychotherapist, a daughter 68 and a son 17.  She is employed at [employer].  She is a Merchandiser, retail of juvenile records.  FAMILY HISTORY:  Her mother age 58 still lives at home but apparently recently severely burned her hand.  It was thought she might need skin grafts but she does not, then her brother just died.  ALCOHOL AND DRUG HISTORY:  Apparently there is a heavy family history for alcoholism.  MEDICAL PRIMARY CARE:  Dr. Nila Patel.  She in fact has an upcoming appointment on Tuesday.  She does see a therapist, Dr. Andi Patel.  MEDICAL PROBLEMS:  She is known to have hypertension, GERD, asthma and depression.  She is status post knee replacement, one knee April 11, the other May 23.  MEDICATIONS:  As per her discharge summary on May 25. 1. She is on Percocet  10/325 one q.6 h p.r.n. pain. 2. Robaxin 500 mg 1 tablet p.o. q.6 h p.r.n. for spasms. 3. Coumadin maintain her INR 2-3 times for 4 weeks postop for DVT     prophylaxis. 4. Lovenox 30 mg one subcu injection q.12 h and discontinue when     Coumadin is therapeutic. 5. Flonase nasal spray 1 spray each nostril daily as needed. 6. Albuterol inhaler 1 puff daily as needed. 7. Serevent inhaler 1 puff daily as needed. 8. EpiPen subcu 1 injection daily p.r.n. 9. Xanax 0.25 mg 1 tablet p.o. q.8 h p.r.n. anxiety. 10.Omeprazole 20 mg 1 tablet p.o. daily. 11.Fexofenadine 180 mg tablet 1 p.o. daily. 12.Fluoxetine 40 mg p.o. daily. 13.Doxazosin 8 mg tablet 1 tablet p.o. daily. 14.Norvasc 10 mg tablet 1 p.o. daily. 15.Atenolol 100 mg tablet 1 p.o. daily.  She does in fact get her medications at [facility].  I did not call to verify these medications, I am just taking it from the discharge summary.  DRUG ALLERGIES:  No known drug allergies.  POSITIVE PHYSICAL FINDINGS:  She does have  incisions on both knees indicative of bilateral knee replacements.  She does not have swelling or redness.  She is using ice however.  In the emergency room her vital signs were stable.  She was afebrile 97.9, pulse was 79, respirations 18, blood pressure was 100/83.  UDS was positive for opiates and benzodiazepines but she is prescribed.  Her urinalysis had a moderate amount of leukocyte esterase.  She apparently does have frequent UTIs. We will treat that.  No other physical findings.  MENTAL STATUS EXAM:  Today she is alert and oriented.  She is casually groomed, dressed and nourished.  She is using a cane and ice packs on her knees to ambulate.  Her speech is not pressured.  Her mood is less distressed apparently than yesterday.  She is not actively suicidal or homicidal.  She is not having auditory or visual hallucinations. Judgment and insight are intact.  She stated that she thought she was okay and was going to make it to her appointment with Dr. Chilton Si, however, the news of her brother's death tipped her over the edge. Concentration and memory are intact.  Intelligence is at least average.  DIAGNOSES:  AXIS I:  Major depressive disorder recurrent severe without psychotic features.  She reports an increase in depression since the second surgery May 23. AXIS II:  None. 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  History for anemia, hypertension, gastroesophageal reflux disease and asthma. AXIS IV:  Grief. AXIS V:  20.  PLAN:  To admit for safety and stabilization.  The patient was asking if there was something that could be given for her anxiety that did not have such an abrupt on and off experience as the Xanax and I told her we could change it to Klonopin.  She is also not sleeping as well as she might and her appetite is off so will give her Remeron 15 mg at h.s. She can also be considered for  the intensive outpatient program. Estimated length of stay is just 2-3 days.     Mickie Leonarda Salon, P.A.-C.   ______________________________ Erica Lacrosse, MD    MD/MEDQ  D:  07/24/2010  T:  07/24/2010  Job:  [id number]  Electronically Signed by Jaci Lazier ADAMS P.A.-C. on 07/25/2010 06:16:43 PM Electronically Signed by Erica Devon Khaliq Turay  on 07/26/2010 08:14:20 AM

## 2011-02-02 ENCOUNTER — Other Ambulatory Visit: Payer: Self-pay | Admitting: Internal Medicine

## 2011-02-02 DIAGNOSIS — Z1231 Encounter for screening mammogram for malignant neoplasm of breast: Secondary | ICD-10-CM

## 2011-02-16 ENCOUNTER — Ambulatory Visit
Admission: RE | Admit: 2011-02-16 | Discharge: 2011-02-16 | Disposition: A | Discharge status: Home / Self Care | Payer: BC Managed Care – PPO | Source: Ambulatory Visit | Attending: Internal Medicine | Admitting: Internal Medicine

## 2011-02-16 DIAGNOSIS — Z1231 Encounter for screening mammogram for malignant neoplasm of breast: Secondary | ICD-10-CM

## 2011-10-03 ENCOUNTER — Encounter: Payer: Self-pay | Admitting: Gastroenterology

## 2011-12-04 ENCOUNTER — Encounter: Payer: Self-pay | Admitting: Gastroenterology

## 2011-12-14 ENCOUNTER — Ambulatory Visit (AMBULATORY_SURGERY_CENTER): Payer: BC Managed Care – PPO | Admitting: *Deleted

## 2011-12-14 VITALS — Ht 64.0 in | Wt 240.0 lb

## 2011-12-14 DIAGNOSIS — Z1211 Encounter for screening for malignant neoplasm of colon: Secondary | ICD-10-CM

## 2011-12-14 MED ORDER — NA SULFATE-K SULFATE-MG SULF 17.5-3.13-1.6 GM/177ML PO SOLN: ORAL | Status: DC

## 2011-12-14 MED ORDER — SUPREP BOWEL PREP KIT 17.5-3.13-1.6 GM/177ML PO SOLN: ORAL | Status: DC

## 2012-01-05 ENCOUNTER — Encounter: Payer: Self-pay | Admitting: *Deleted

## 2012-01-05 ENCOUNTER — Encounter: Payer: Self-pay | Admitting: Gastroenterology

## 2012-01-05 ENCOUNTER — Ambulatory Visit (AMBULATORY_SURGERY_CENTER): Payer: BC Managed Care – PPO | Admitting: Gastroenterology

## 2012-01-05 VITALS — BP 142/84 | HR 56 | Temp 99.4°F | Resp 16 | Ht 64.0 in | Wt 240.0 lb

## 2012-01-05 DIAGNOSIS — Z1211 Encounter for screening for malignant neoplasm of colon: Secondary | ICD-10-CM

## 2012-01-05 DIAGNOSIS — K5730 Diverticulosis of large intestine without perforation or abscess without bleeding: Secondary | ICD-10-CM

## 2012-01-05 MED ORDER — SODIUM CHLORIDE 0.9 % IV SOLN: 500.0000 mL | INTRAVENOUS | Status: DC

## 2012-01-05 NOTE — Patient Instructions (Addendum)
YOU HAD AN ENDOSCOPIC PROCEDURE TODAY AT THE [facility]: Refer to the procedure report that was given to you for any specific questions about what was found during the examination.  If the procedure report does not answer your questions, please call your gastroenterologist to clarify.  If you requested that your care partner not be given the details of your procedure findings, then the procedure report has been included in a sealed envelope for you to review at your convenience later.  YOU SHOULD EXPECT: Some feelings of bloating in the abdomen. Passage of more gas than usual.  Walking can help get rid of the air that was put into your GI tract during the procedure and reduce the bloating. If you had a lower endoscopy (such as a colonoscopy or flexible sigmoidoscopy) you may notice spotting of blood in your stool or on the toilet paper. If you underwent a bowel prep for your procedure, then you may not have a normal bowel movement for a few days.  DIET: Your first meal following the procedure should be a light meal and then it is ok to progress to your normal diet.  A half-sandwich or bowl of soup is an example of a good first meal.  Heavy or fried foods are harder to digest and may make you feel nauseous or bloated.  Likewise meals heavy in dairy and vegetables can cause extra gas to form and this can also increase the bloating.  Drink plenty of fluids but you should avoid alcoholic beverages for 24 hours.  ACTIVITY: Your care partner should take you home directly after the procedure.  You should plan to take it easy, moving slowly for the rest of the day.  You can resume normal activity the day after the procedure however you should NOT DRIVE or use heavy machinery for 24 hours (because of the sedation medicines used during the test).    SYMPTOMS TO REPORT IMMEDIATELY: A gastroenterologist can be reached at any hour.  During normal business hours, 8:30 AM to 5:00 PM Monday through Friday,  call [phone].  After hours and on weekends, please call the GI answering service at [phone] who will take a message and have the physician on call contact you.   Following lower endoscopy (colonoscopy or flexible sigmoidoscopy):  Excessive amounts of blood in the stool  Significant tenderness or worsening of abdominal pains  Swelling of the abdomen that is new, acute  Fever of 100F or higher   FOLLOW UP: If any biopsies were taken you will be contacted by phone or by letter within the next 1-3 weeks.  Call your gastroenterologist if you have not heard about the biopsies in 3 weeks.  Our staff will call the home number listed on your records the next business day following your procedure to check on you and address any questions or concerns that you may have at that time regarding the information given to you following your procedure. This is a courtesy call and so if there is no answer at the home number and we have not heard from you through the emergency physician on call, we will assume that you have returned to your regular daily activities without incident.  SIGNATURES/CONFIDENTIALITY: You and/or your care partner have signed paperwork which will be entered into your electronic medical record.  These signatures attest to the fact that that the information above on your After Visit Summary has been reviewed and is understood.  Full responsibility of the confidentiality of   this discharge information lies with you and/or your care-partner.   Resume medications. Information on diverticulosis and high fiber diet given with discharge instructions. 

## 2012-01-05 NOTE — Progress Notes (Signed)
Patient did not experience any of the following events: a burn prior to discharge; a fall within the facility; wrong site/side/patient/procedure/implant event; or a hospital transfer or hospital admission upon discharge from the facility. (G8907) Patient did not have preoperative order for IV antibiotic SSI prophylaxis. (G8918)  

## 2012-01-05 NOTE — Op Note (Signed)
[facility] [address] [place], [address]   COLONOSCOPY PROCEDURE REPORT  PATIENT: [name], [name]  MR#: [id number] BIRTHDATE: 12/05/1945 , 65  yrs. old GENDER: Female ENDOSCOPIST: Louis Meckel, MD REFERRED UU:[id number] Chilton Si, M.D. PROCEDURE DATE:  01/05/2012 PROCEDURE:   Colonoscopy, diagnostic ASA CLASS:   Class II INDICATIONS:average risk screening. MEDICATIONS: MAC sedation, administered by CRNA and propofol (Diprivan) 200mg  IV  DESCRIPTION OF PROCEDURE:   After the risks benefits and alternatives of the procedure were thoroughly explained, informed consent was obtained.  A digital rectal exam revealed no abnormalities of the rectum.   The LB [id number]-[id number]  endoscope was introduced through the anus and advanced to the cecum, which was identified by both the appendix and ileocecal valve. No adverse events experienced.   The quality of the prep was Suprep excellent The instrument was then slowly withdrawn as the colon was fully examined.      COLON FINDINGS: Mild diverticulosis was noted in the sigmoid colon. The colon mucosa was otherwise normal.  Retroflexed views revealed no abnormalities. The time to cecum=2 minutes 23 seconds. Withdrawal time=7 minutes 59 seconds.  The scope was withdrawn and the procedure completed. COMPLICATIONS: There were no complications.  ENDOSCOPIC IMPRESSION: 1.   Mild diverticulosis was noted in the sigmoid colon 2.   The colon mucosa was otherwise normal  RECOMMENDATIONS: Continue current colorectal screening recommendations for "routine risk" patients with a repeat colonoscopy in 10 years.   eSigned:  Louis Meckel, MD 01/05/2012 11:35 AM   cc:

## 2012-01-08 ENCOUNTER — Telehealth: Payer: Self-pay | Admitting: *Deleted

## 2012-01-08 NOTE — Telephone Encounter (Signed)
  Follow up Call-  Call back number 01/05/2012  Post procedure Call Back phone  # [phone]  Permission to leave phone message Yes     Patient questions:  Do you have a fever, pain , or abdominal swelling? no Pain Score  0 *  Have you tolerated food without any problems? yes  Have you been able to return to your normal activities? yes  Do you have any questions about your discharge instructions: Diet   no Medications  no Follow up visit  no  Do you have questions or concerns about your Care? no  Actions: * If pain score is 4 or above: No action needed, pain <4.

## 2012-01-16 ENCOUNTER — Other Ambulatory Visit: Payer: Self-pay | Admitting: Internal Medicine

## 2012-01-16 DIAGNOSIS — Z1231 Encounter for screening mammogram for malignant neoplasm of breast: Secondary | ICD-10-CM

## 2012-02-20 ENCOUNTER — Ambulatory Visit
Admission: RE | Admit: 2012-02-20 | Discharge: 2012-02-20 | Disposition: A | Discharge status: Home / Self Care | Payer: BC Managed Care – PPO | Source: Ambulatory Visit | Attending: Internal Medicine | Admitting: Internal Medicine

## 2012-02-20 DIAGNOSIS — Z1231 Encounter for screening mammogram for malignant neoplasm of breast: Secondary | ICD-10-CM

## 2012-03-06 ENCOUNTER — Ambulatory Visit (INDEPENDENT_AMBULATORY_CARE_PROVIDER_SITE_OTHER): Payer: BC Managed Care – PPO | Admitting: Emergency Medicine

## 2012-03-06 VITALS — BP 170/77 | HR 70 | Temp 99.7°F | Ht 64.0 in | Wt 220.0 lb

## 2012-03-06 DIAGNOSIS — J9801 Acute bronchospasm: Secondary | ICD-10-CM

## 2012-03-06 DIAGNOSIS — J111 Influenza due to unidentified influenza virus with other respiratory manifestations: Secondary | ICD-10-CM

## 2012-03-06 DIAGNOSIS — R05 Cough: Secondary | ICD-10-CM

## 2012-03-06 LAB — POCT INFLUENZA A/B
Influenza A, POC: NEGATIVE
Influenza B, POC: NEGATIVE

## 2012-03-06 MED ORDER — HYDROCOD POLST-CHLORPHEN POLST 10-8 MG/5ML PO LQCR: 5.0000 mL | Freq: Two times a day (BID) | ORAL | Status: DC | PRN

## 2012-03-06 MED ORDER — OSELTAMIVIR PHOSPHATE 75 MG PO CAPS: 75.0000 mg | ORAL_CAPSULE | Freq: Two times a day (BID) | ORAL | Status: DC

## 2012-03-06 MED ORDER — ALBUTEROL SULFATE HFA 108 (90 BASE) MCG/ACT IN AERS: 2.0000 | INHALATION_SPRAY | RESPIRATORY_TRACT | Status: DC | PRN

## 2012-03-06 NOTE — Patient Instructions (Signed)

## 2012-03-06 NOTE — Progress Notes (Signed)
[facility] [address] [place] [address] [phone]  Date:  03/06/2012   Name:  [name]   DOB:  03-27-1945   MRN:  [id number]  PCP:  Enrique Sack, MD    Chief Complaint: Cough, Nasal Congestion, Nausea and Headache   History of Present Illness:  [name] is a 67 y.o. very pleasant female patient who presents with the following:  Suddenly ill yesterday afternoon with nonproductive cough and fever. Has myalgias and arthralgias and malaise. No nausea or vomiting.  Wheezing but not using inhaler.  No  shortness of breath.  Has a headache and nasal congestion and post nasal drainage. No sore throat.  No improvement with OTC medications.  Had a flu shot this year  There is no problem list on file for this patient.   Past Medical History  Diagnosis Date  . Allergy     Hay fever/bee stings  . Anxiety   . Arthritis   . Asthma   . Blood transfusion without reported diagnosis 2012  . Cataract   . Depression   . GERD (gastroesophageal reflux disease)   . Heart murmur   . Hypertension     Past Surgical History  Procedure Date  . Melanoma excision 2009    left shoulder near neck  . Abdominal hysterectomy 1986  . Replacement total knee bilateral 05/2010 &05/2010    History  Substance Use Topics  . Smoking status: Never Smoker   . Smokeless tobacco: Never Used  . Alcohol Use: No    No family history on file.  No Known Allergies  Medication list has been reviewed and updated.  Current Outpatient Prescriptions on File Prior to Visit  Medication Sig Dispense Refill  . albuterol (PROVENTIL HFA;VENTOLIN HFA) 108 (90 BASE) MCG/ACT inhaler Inhale 2 puffs into the lungs every 6 (six) hours as needed.      Marland Kitchen atenolol (TENORMIN) 100 MG tablet Take 1 tablet by mouth Daily.      . clobetasol cream (TEMOVATE) 0.05 % Apply 1 application topically as needed. psoriasis      . clonazePAM (KLONOPIN) 0.5 MG tablet Take 0.5 mg by mouth  before cath procedure. anxiety      . diclofenac sodium (VOLTAREN) 1 % GEL Apply 1 application topically daily.      Marland Kitchen doxazosin (CARDURA) 8 MG tablet Take 1 tablet by mouth Daily.      Marland Kitchen EPINEPHrine (EPIPEN JR) 0.15 MG/0.3ML injection Inject 0.15 mg into the muscle as needed.      . fexofenadine (ALLEGRA) 180 MG tablet Take 180 mg by mouth daily.      . fluconazole (DIFLUCAN) 10 MG/ML suspension Take by mouth as directed. For fungal infection of nails      . FLUoxetine (PROZAC) 40 MG capsule Take 1 tablet by mouth Daily.      . fluticasone (FLONASE) 50 MCG/ACT nasal spray Place 2 sprays into the nose daily.      . hydrOXYzine (ATARAX/VISTARIL) 50 MG tablet Take 1 tablet by mouth At bedtime as needed. For sleep      . losartan (COZAAR) 100 MG tablet Take 50 mg by mouth Daily.      . mirtazapine (REMERON) 15 MG tablet Take 1 tablet by mouth Daily. Monday -Friday only  Gradually tapering dose      . Omeprazole 20 MG TBEC Take 1 tablet by mouth Daily.      . salmeterol (SEREVENT) 50 MCG/DOSE diskus inhaler  Inhale 1 puff into the lungs daily.      . traMADol-acetaminophen (ULTRACET) 37.5-325 MG per tablet Take 2 tablets by mouth as needed. pain      . meloxicam (MOBIC) 15 MG tablet Take 1 tablet by mouth Daily.        Review of Systems:  As per HPI, otherwise negative.    Physical Examination: Filed Vitals:   03/06/12 1223  BP: 170/77  Pulse: 70  Temp: 99.7 F (37.6 C)   Filed Vitals:   03/06/12 1223  Height: 5\' 4"  (1.626 m)  Weight: 220 lb (99.791 kg)   Body mass index is 37.76 kg/(m^2). Ideal Body Weight: Weight in (lb) to have BMI = 25: 145.3   GEN: WDWN, NAD, Non-toxic, A & O x 3 HEENT: Atraumatic, Normocephalic. Neck supple. No masses, No LAD. Ears and Nose: No external deformity. CV: RRR, No M/G/R. No JVD. No thrill. No extra heart sounds. PULM: CTA B, diffuse wheezes, no crackles, rhonchi. No retractions. No resp. distress. No accessory muscle use. ABD: S, NT, ND, +BS.  No rebound. No HSM. EXTR: No c/c/e NEURO Normal gait.  PSYCH: Normally interactive. Conversant. Not depressed or anxious appearing.  Calm demeanor.    Assessment and Plan: Influenza Bronchospasm neb  Carmelina Dane, MD  Results for orders placed in visit on 03/06/12  POCT INFLUENZA A/B      Component Value Range   Influenza A, POC Negative     Influenza B, POC Negative

## 2012-03-11 ENCOUNTER — Telehealth: Payer: Self-pay

## 2012-03-11 NOTE — Telephone Encounter (Signed)
PT STATES SHE WAS SEEN FOR THE FLU AND IS STILL HURTING. IS THAT NORMAL. PLEASE CALL [phone]

## 2012-03-11 NOTE — Telephone Encounter (Signed)
She feels weak. She is advised this is expected after the flu. Her fever has gone away now, but the fatigue continues. She is asking for a work note for today, I have provided this for her. She is advised if her cough becomes more productive or her fever returns , she should return to clinic.

## 2013-01-01 ENCOUNTER — Ambulatory Visit
Admission: RE | Admit: 2013-01-01 | Discharge: 2013-01-01 | Disposition: A | Discharge status: Home / Self Care | Payer: BC Managed Care – PPO | Source: Ambulatory Visit | Attending: Internal Medicine | Admitting: Internal Medicine

## 2013-01-01 ENCOUNTER — Other Ambulatory Visit: Payer: Self-pay | Admitting: Internal Medicine

## 2013-01-01 DIAGNOSIS — R062 Wheezing: Secondary | ICD-10-CM

## 2013-01-01 DIAGNOSIS — R05 Cough: Secondary | ICD-10-CM

## 2013-01-28 ENCOUNTER — Other Ambulatory Visit: Payer: Self-pay | Admitting: Internal Medicine

## 2013-01-28 ENCOUNTER — Ambulatory Visit
Admission: RE | Admit: 2013-01-28 | Discharge: 2013-01-28 | Disposition: A | Discharge status: Home / Self Care | Payer: BC Managed Care – PPO | Source: Ambulatory Visit | Attending: Internal Medicine | Admitting: Internal Medicine

## 2013-01-28 DIAGNOSIS — R05 Cough: Secondary | ICD-10-CM

## 2013-01-28 DIAGNOSIS — R062 Wheezing: Secondary | ICD-10-CM

## 2013-02-07 ENCOUNTER — Other Ambulatory Visit: Payer: Self-pay

## 2013-02-07 DIAGNOSIS — Z1231 Encounter for screening mammogram for malignant neoplasm of breast: Secondary | ICD-10-CM

## 2013-03-04 ENCOUNTER — Ambulatory Visit
Admission: RE | Admit: 2013-03-04 | Discharge: 2013-03-04 | Disposition: A | Discharge status: Home / Self Care | Payer: BC Managed Care – PPO | Source: Ambulatory Visit

## 2013-03-04 DIAGNOSIS — Z1231 Encounter for screening mammogram for malignant neoplasm of breast: Secondary | ICD-10-CM

## 2013-04-18 ENCOUNTER — Ambulatory Visit (INDEPENDENT_AMBULATORY_CARE_PROVIDER_SITE_OTHER): Payer: BC Managed Care – PPO | Admitting: Family Medicine

## 2013-04-18 ENCOUNTER — Ambulatory Visit: Payer: BC Managed Care – PPO

## 2013-04-18 VITALS — BP 160/80 | HR 57 | Temp 97.7°F | Resp 16 | Ht 64.0 in | Wt 205.0 lb

## 2013-04-18 DIAGNOSIS — R059 Cough, unspecified: Secondary | ICD-10-CM

## 2013-04-18 DIAGNOSIS — J029 Acute pharyngitis, unspecified: Secondary | ICD-10-CM

## 2013-04-18 DIAGNOSIS — R05 Cough: Secondary | ICD-10-CM

## 2013-04-18 DIAGNOSIS — I10 Essential (primary) hypertension: Secondary | ICD-10-CM | POA: Insufficient documentation

## 2013-04-18 DIAGNOSIS — J45909 Unspecified asthma, uncomplicated: Secondary | ICD-10-CM | POA: Insufficient documentation

## 2013-04-18 DIAGNOSIS — J209 Acute bronchitis, unspecified: Secondary | ICD-10-CM

## 2013-04-18 LAB — POCT RAPID STREP A (OFFICE): RAPID STREP A SCREEN: NEGATIVE

## 2013-04-18 MED ORDER — CEFDINIR 300 MG PO CAPS: 300.0000 mg | ORAL_CAPSULE | Freq: Two times a day (BID) | ORAL | Status: DC

## 2013-04-18 NOTE — Progress Notes (Signed)
[facility] [address] [place] [address] [phone]  Date:  04/18/2013   Name:  [name]   DOB:  15-Nov-1945   MRN:  [id number]  PCP:  Criselda Peaches, MD    Chief Complaint: Cough and Sore Throat   History of Present Illness:  [name] is a 68 y.o. very pleasant female patient who presents with the following:  She is here today with illness.  She noted onset of a cough about one week ago.  She is losing her voice, and has noted a ST with "white spots" on her throat. She has noted the ST just today.    The cough can be productive of "gross" browning- greenish mucus.   She has not noted a fever.   No body aches or chills.   No GI symptoms.   She is still taking her BP medications- she has been taking mucinex D for her cough.    There are no active problems to display for this patient.   Past Medical History  Diagnosis Date  . Allergy     Hay fever/bee stings  . Anxiety   . Arthritis   . Asthma   . Blood transfusion without reported diagnosis 2012  . Cataract   . Depression   . GERD (gastroesophageal reflux disease)   . Heart murmur   . Hypertension     Past Surgical History  Procedure Laterality Date  . Melanoma excision  2009    left shoulder near neck  . Abdominal hysterectomy  1986  . Replacement total knee bilateral  05/2010 &05/2010    History  Substance Use Topics  . Smoking status: Never Smoker   . Smokeless tobacco: Never Used  . Alcohol Use: No    History reviewed. No pertinent family history.  No Known Allergies  Medication list has been reviewed and updated.  Current Outpatient Prescriptions on File Prior to Visit  Medication Sig Dispense Refill  . albuterol (PROVENTIL HFA;VENTOLIN HFA) 108 (90 BASE) MCG/ACT inhaler Inhale 2 puffs into the lungs every 6 (six) hours as needed.      Marland Kitchen albuterol (PROVENTIL HFA;VENTOLIN HFA) 108 (90 BASE) MCG/ACT inhaler Inhale 2 puffs into the lungs every 4  (four) hours as needed for wheezing (cough, shortness of breath or wheezing.).  1 Inhaler  1  . atenolol (TENORMIN) 100 MG tablet Take 1 tablet by mouth Daily.      . chlorpheniramine-HYDROcodone (TUSSIONEX PENNKINETIC ER) 10-8 MG/5ML LQCR Take 5 mLs by mouth every 12 (twelve) hours as needed (cough).  60 mL  0  . clobetasol cream (TEMOVATE) 1.54 % Apply 1 application topically as needed. psoriasis      . clonazePAM (KLONOPIN) 0.5 MG tablet Take 0.5 mg by mouth before cath procedure. anxiety      . diclofenac sodium (VOLTAREN) 1 % GEL Apply 1 application topically daily.      Marland Kitchen doxazosin (CARDURA) 8 MG tablet Take 1 tablet by mouth Daily.      Marland Kitchen EPINEPHrine (EPIPEN JR) 0.15 MG/0.3ML injection Inject 0.15 mg into the muscle as needed.      . fexofenadine (ALLEGRA) 180 MG tablet Take 180 mg by mouth daily.      . fluconazole (DIFLUCAN) 10 MG/ML suspension Take by mouth as directed. For fungal infection of nails      . FLUoxetine (PROZAC) 40 MG capsule Take 1 tablet by mouth Daily.      . fluticasone (FLONASE) 50  MCG/ACT nasal spray Place 2 sprays into the nose daily.      Marland Kitchen losartan (COZAAR) 100 MG tablet Take 50 mg by mouth Daily.      . mirtazapine (REMERON) 15 MG tablet Take 1 tablet by mouth Daily. Monday -Friday only  Gradually tapering dose      . Omeprazole 20 MG TBEC Take 1 tablet by mouth Daily.      . salmeterol (SEREVENT) 50 MCG/DOSE diskus inhaler Inhale 1 puff into the lungs daily.      . traMADol-acetaminophen (ULTRACET) 37.5-325 MG per tablet Take 2 tablets by mouth as needed. pain      . hydrOXYzine (ATARAX/VISTARIL) 50 MG tablet Take 1 tablet by mouth At bedtime as needed. For sleep       No current facility-administered medications on file prior to visit.    Review of Systems:  As per HPI- otherwise negative.  Physical Examination: Filed Vitals:   04/18/13 1819  BP: 180/78  Pulse: 57  Temp: 97.7 F (36.5 C)  Resp: 16   Filed Vitals:   04/18/13 1819  Height: 5\' 4"   (1.626 m)  Weight: 205 lb (92.987 kg)   Body mass index is 35.17 kg/(m^2). Ideal Body Weight: Weight in (lb) to have BMI = 25: 145.3  GEN: WDWN, NAD, Non-toxic, A & O x 3 HEENT: Atraumatic, Normocephalic. Neck supple. No masses, No LAD.  Bilateral TM wnl, oropharynx normal; enlarged but benign appearing tonsils with likely food in craters.  PEERL,EOMI.   Ears and Nose: No external deformity. CV: RRR, No M/G/R. No JVD. No thrill. No extra heart sounds. PULM: CTA B, no wheezes, crackles, rhonchi. No retractions. No resp. distress. No accessory muscle use. ABD: S, NT, ND, +BS. No rebound. No HSM. EXTR: No c/c/e NEURO Normal gait.  PSYCH: Normally interactive. Conversant. Not depressed or anxious appearing.  Calm demeanor.   [facility] reading (PRIMARY) by  Dr. Lorelei Pont. CXR: no acute infiltrate, stable from last CXR  CHEST 2 VIEW  COMPARISON: January 28, 2013.  FINDINGS: Stable cardiomediastinal silhouette. Large hiatal hernia is stable as well. No pleural effusion or pneumothorax is noted. No acute pulmonary disease is noted. Bony thorax is intact.  IMPRESSION: Stable hiatal hernia. No acute cardiopulmonary abnormality seen.   Results for orders placed in visit on 04/18/13  POCT RAPID STREP A (OFFICE)      Result Value Ref Range   Rapid Strep A Screen Negative  Negative   Assessment and Plan: Acute pharyngitis - Plan: POCT rapid strep A, Culture, Group A Strep, cefdinir (OMNICEF) 300 MG capsule  Cough - Plan: DG Chest 2 View, cefdinir (OMNICEF) 300 MG capsule  Acute bronchitis - Plan: cefdinir (OMNICEF) 300 MG capsule  Await throat culture but doubt strep.  Treat with omnicef for bronchitis.   Elevated BP.  She denies any CP or headache.  Encouraged to d/c decongestant use as this may be raising her BP.   Signed Lamar Blinks, MD

## 2013-04-18 NOTE — Patient Instructions (Signed)
Let me know if you do not feel better in the next few days- Sooner if worse.   Avoid medications with decongestants- these can raise your BP

## 2013-04-21 LAB — CULTURE, GROUP A STREP: ORGANISM ID, BACTERIA: NORMAL

## 2013-04-23 ENCOUNTER — Telehealth: Payer: Self-pay

## 2013-04-23 MED ORDER — AZITHROMYCIN 250 MG PO TABS: ORAL_TABLET | ORAL | Status: DC

## 2013-04-23 NOTE — Telephone Encounter (Signed)
Patient is having diarrhea from the antibiotics - can she get something else called into [facility] on [address].   Patient completed forms on Friday to have medical release to Dr. Levin Erp for her OV for this date.  Please send OV notes over.    [phone]

## 2013-04-23 NOTE — Telephone Encounter (Signed)
Spoke with pt. She is having watery uncontrolled stools and unable to go to work. She is however staying hydrated, but she really wants the other rx called in for her. Sent it to her [place]

## 2013-04-23 NOTE — Telephone Encounter (Signed)
Diarrhea  began Sunday, no abdominal pain, taking on a full stomach, no nausea. She has missed the past two days of work due to this. Please advise.

## 2013-04-23 NOTE — Telephone Encounter (Signed)
If the stools are loose, but controlled, and she's staying hydrated, it is an option to complete the antibiotic as prescribed.  If the stools are watery, uncontrolled or she's unable to stay hydrated, stop the [place]. Start Azithromycin 250 mg, 2 PO QD x 1, then 1 PO QD x 4; #6, NO RF.

## 2014-02-03 ENCOUNTER — Other Ambulatory Visit: Payer: Self-pay

## 2014-02-03 DIAGNOSIS — Z1231 Encounter for screening mammogram for malignant neoplasm of breast: Secondary | ICD-10-CM

## 2014-03-10 ENCOUNTER — Ambulatory Visit
Admission: RE | Admit: 2014-03-10 | Discharge: 2014-03-10 | Disposition: A | Discharge status: Home / Self Care | Payer: BC Managed Care – PPO | Source: Ambulatory Visit

## 2014-03-10 DIAGNOSIS — Z1231 Encounter for screening mammogram for malignant neoplasm of breast: Secondary | ICD-10-CM

## 2015-03-30 ENCOUNTER — Other Ambulatory Visit: Payer: Self-pay

## 2015-03-30 DIAGNOSIS — Z1231 Encounter for screening mammogram for malignant neoplasm of breast: Secondary | ICD-10-CM

## 2015-04-13 ENCOUNTER — Ambulatory Visit
Admission: RE | Admit: 2015-04-13 | Discharge: 2015-04-13 | Disposition: A | Discharge status: Home / Self Care | Payer: Medicare Other | Source: Ambulatory Visit

## 2015-04-13 DIAGNOSIS — Z1231 Encounter for screening mammogram for malignant neoplasm of breast: Secondary | ICD-10-CM

## 2015-04-16 ENCOUNTER — Other Ambulatory Visit: Payer: Self-pay | Admitting: Internal Medicine

## 2015-04-16 DIAGNOSIS — R928 Other abnormal and inconclusive findings on diagnostic imaging of breast: Secondary | ICD-10-CM

## 2015-04-22 ENCOUNTER — Ambulatory Visit
Admission: RE | Admit: 2015-04-22 | Discharge: 2015-04-22 | Disposition: A | Discharge status: Home / Self Care | Payer: Medicare Other | Source: Ambulatory Visit | Attending: Internal Medicine | Admitting: Internal Medicine

## 2015-04-22 DIAGNOSIS — R928 Other abnormal and inconclusive findings on diagnostic imaging of breast: Secondary | ICD-10-CM

## 2016-01-13 ENCOUNTER — Encounter (HOSPITAL_BASED_OUTPATIENT_CLINIC_OR_DEPARTMENT_OTHER): Payer: Self-pay

## 2016-01-13 ENCOUNTER — Emergency Department (HOSPITAL_BASED_OUTPATIENT_CLINIC_OR_DEPARTMENT_OTHER)
Admission: EM | Admit: 2016-01-13 | Discharge: 2016-01-13 | Disposition: A | Discharge status: Home / Self Care | Payer: Medicare Other | Attending: Emergency Medicine | Admitting: Emergency Medicine

## 2016-01-13 DIAGNOSIS — I10 Essential (primary) hypertension: Secondary | ICD-10-CM | POA: Diagnosis not present

## 2016-01-13 DIAGNOSIS — Y999 Unspecified external cause status: Secondary | ICD-10-CM | POA: Diagnosis not present

## 2016-01-13 DIAGNOSIS — J45909 Unspecified asthma, uncomplicated: Secondary | ICD-10-CM | POA: Diagnosis not present

## 2016-01-13 DIAGNOSIS — S0101XA Laceration without foreign body of scalp, initial encounter: Secondary | ICD-10-CM | POA: Diagnosis not present

## 2016-01-13 DIAGNOSIS — Z79899 Other long term (current) drug therapy: Secondary | ICD-10-CM | POA: Insufficient documentation

## 2016-01-13 DIAGNOSIS — Y929 Unspecified place or not applicable: Secondary | ICD-10-CM | POA: Diagnosis not present

## 2016-01-13 DIAGNOSIS — W01198A Fall on same level from slipping, tripping and stumbling with subsequent striking against other object, initial encounter: Secondary | ICD-10-CM | POA: Diagnosis not present

## 2016-01-13 DIAGNOSIS — Y9389 Activity, other specified: Secondary | ICD-10-CM | POA: Insufficient documentation

## 2016-01-13 MED ORDER — LIDOCAINE HCL (PF) 1 % IJ SOLN
2.0000 mL | Freq: Once | INTRAMUSCULAR | Status: AC
  Administered 2016-01-13: 2 mL
  Filled 2016-01-13: qty 5

## 2016-01-13 NOTE — Discharge Instructions (Signed)
Staple removal in 7 days.

## 2016-01-13 NOTE — ED Notes (Signed)
Pt alert, NAD, calm, interactive, appropriate, resps e/u, speaking in clear complete sentences, no dyspnea noted, here for fall with L parietal head lac, head stapled, approximated well, 2 staples in place, "feel better". Denies other complaints or injuries. TD UTD.

## 2016-01-13 NOTE — ED Triage Notes (Signed)
Pt states she tripped over feet when she was trying to get a scarf out of the closet, hit her head on the floor and then on the dresser, denies LOC, has a 1cm jagged laceration to the top of her head.

## 2016-01-13 NOTE — ED Provider Notes (Signed)
[facility] Provider Note   CSN: [id number] Arrival date & time: 01/13/16  2232  By signing my name below, I, Emmanuella Mensah, attest that this documentation has been prepared under the direction and in the presence of Etta Quill, NP. Electronically Signed: Judithann Sauger, ED Scribe. 01/13/16. 10:56 PM.   History   Chief Complaint Chief Complaint  Patient presents with  . Head Injury    HPI Comments: [name] is a 70 y.o. female with a hx of hypertension who presents to the Emergency Department complaining of a laceration to the left parietal region of the head s/p head injury that occurred PTA. She explains that she tripped over her feet as she tried to grab a scarf, falling to the floor, hitting her head on the dresser on her way down. She denies any LOC or any other injuries. No alleviating factors noted. Pt has not tried any medications PTA. She has NKDA. She states that her tetanus vaccine is UTD. She denies any fever, chills, dizziness, lightheadedness, numbness, or any other symptoms.   The history is provided by the patient. No language interpreter was used.  Head Injury   The incident occurred 1 to 2 hours ago. She came to the ER via walk-in. The injury mechanism was a fall. There was no loss of consciousness. The pain is moderate. The pain has been constant since the injury. Pertinent negatives include no numbness and no vomiting. She has tried nothing for the symptoms.    Past Medical History:  Diagnosis Date  . Allergy    Hay fever/bee stings  . Anxiety   . Arthritis   . Asthma   . Blood transfusion without reported diagnosis 2012  . Cataract   . Depression   . GERD (gastroesophageal reflux disease)   . Heart murmur   . Hypertension     Patient Active Problem List   Diagnosis Date Noted  . Asthma, chronic 04/18/2013  . HTN (hypertension) 04/18/2013    Past Surgical History:  Procedure Laterality Date  . ABDOMINAL HYSTERECTOMY   1986  . MELANOMA EXCISION  2009   left shoulder near neck  . 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 Authorizing Provider  albuterol (PROVENTIL HFA;VENTOLIN HFA) 108 (90 BASE) MCG/ACT inhaler Inhale 2 puffs into the lungs every 6 (six) hours as needed.    Historical Provider, MD  albuterol (PROVENTIL HFA;VENTOLIN HFA) 108 (90 BASE) MCG/ACT inhaler Inhale 2 puffs into the lungs every 4 (four) hours as needed for wheezing (cough, shortness of breath or wheezing.). 03/06/12   Roselee Culver, MD  atenolol (TENORMIN) 100 MG tablet Take 1 tablet by mouth Daily. 12/12/11   Historical Provider, MD  azithromycin (ZITHROMAX) 250 MG tablet Take 2 tabs PO x 1 dose, then 1 tab PO QD x 4 days 04/23/13   Harrison Mons, PA-C  cefdinir (OMNICEF) 300 MG capsule Take 1 capsule (300 mg total) by mouth 2 (two) times daily. 04/18/13   Darreld Mclean, MD  chlorpheniramine-HYDROcodone (TUSSIONEX PENNKINETIC ER) 10-8 MG/5ML LQCR Take 5 mLs by mouth every 12 (twelve) hours as needed (cough). 03/06/12   Roselee Culver, MD  clobetasol cream (TEMOVATE) [id number] % Apply 1 application topically as needed. psoriasis    Historical Provider, MD  clonazePAM (KLONOPIN) 0.5 MG  tablet Take 0.5 mg by mouth before cath procedure. anxiety    Historical Provider, MD  diclofenac sodium (VOLTAREN) 1 % GEL Apply 1 application topically daily.    Historical Provider, MD  doxazosin (CARDURA) 8 MG tablet Take 1 tablet by mouth Daily. 10/26/11   Historical Provider, MD  EPINEPHrine (EPIPEN JR) 0.15 MG/0.3ML injection Inject 0.15 mg into the muscle as needed.    Historical Provider, MD  fexofenadine (ALLEGRA) 180 MG tablet Take 180 mg by mouth daily.    Historical Provider, MD  fluconazole (DIFLUCAN) 10 MG/ML suspension Take by mouth as directed. For fungal infection of nails    Historical  Provider, MD  FLUoxetine (PROZAC) 40 MG capsule Take 1 tablet by mouth Daily. 12/05/11   Historical Provider, MD  fluticasone (FLONASE) 50 MCG/ACT nasal spray Place 2 sprays into the nose daily.    Historical Provider, MD  hydrOXYzine (ATARAX/VISTARIL) 50 MG tablet Take 1 tablet by mouth At bedtime as needed. For sleep 10/04/11   Historical Provider, MD  losartan (COZAAR) 100 MG tablet Take 50 mg by mouth Daily. 12/05/11   Historical Provider, MD  mirtazapine (REMERON) 15 MG tablet Take 1 tablet by mouth Daily. Monday -Friday only  Gradually tapering dose 12/12/11   Historical Provider, MD  Omeprazole 20 MG TBEC Take 1 tablet by mouth Daily. 10/03/11   Historical Provider, MD  salmeterol (SEREVENT) 50 MCG/DOSE diskus inhaler Inhale 1 puff into the lungs daily.    Historical Provider, MD  traMADol-acetaminophen (ULTRACET) 37.5-325 MG per tablet Take 2 tablets by mouth as needed. pain 12/12/11   Historical Provider, MD    Family History No family history on file.  Social History Social History  Substance Use Topics  . Smoking status: Never Smoker  . Smokeless tobacco: Never Used  . Alcohol use No     Allergies   Patient has no known allergies.   Review of Systems Review of Systems  Constitutional: Negative for chills and fever.  Gastrointestinal: Negative for nausea and vomiting.  Skin: Positive for wound.  Neurological: Negative for dizziness, light-headedness and numbness.  All other systems reviewed and are negative.    Physical Exam Updated Vital Signs BP 158/74 (BP Location: Right Arm)   Pulse 64   Temp 98 F (36.7 C) (Oral)   Resp 18   Ht 5\' 3"  (1.6 m)   Wt 194 lb (88 kg)   SpO2 100%   BMI 34.37 kg/m   Physical Exam  Constitutional: She is oriented to person, place, and time. She appears well-developed and well-nourished. No distress.  HENT:  Head: Normocephalic and atraumatic.  Marland Kitchen5 cm laceration to the left parietal scalp   Eyes: Conjunctivae and EOM are normal.    Neck: Neck supple. No tracheal deviation present.  Cardiovascular: Normal rate.   Pulmonary/Chest: Effort normal. No respiratory distress.  Musculoskeletal: Normal range of motion.  Neurological: She is alert and oriented to person, place, and time.  Skin: Skin is warm and dry.  Psychiatric: She has a normal mood and affect. Her behavior is normal.  Nursing note and vitals reviewed.    ED Treatments / Results  DIAGNOSTIC STUDIES: Oxygen Saturation is 100% on RA, normal by my interpretation.    COORDINATION OF CARE: 10:50 PM- Pt advised of plan for treatment and pt agrees. Pt will receive laceration repair with staples.    Labs (all labs ordered are listed, but only abnormal results are displayed) Labs Reviewed - No data to display  EKG  EKG Interpretation None       Radiology No results found.  Procedures .Marland KitchenLaceration Repair Date/Time: 01/13/2016 10:51 PM Performed by: Tamala Julian, Aysha Livecchi Authorized by: Tamala Julian, Lysa Livengood   Consent:    Consent obtained:  Verbal   Consent given by:  Patient   Risks discussed:  Pain Anesthesia (see MAR for exact dosages):    Anesthesia method:  Local infiltration   Local anesthetic:  Lidocaine 1% w/o epi Laceration details:    Location:  Scalp   Scalp location:  L parietal   Length (cm):  0.5 Repair type:    Repair type:  Simple Pre-procedure details:    Preparation:  Patient was prepped and draped in usual sterile fashion Skin repair:    Repair method:  Staples   Number of staples:  2 Approximation:    Approximation:  Close   Vermilion border: well-aligned   Post-procedure details:    Dressing:  Open (no dressing)   Patient tolerance of procedure:  Tolerated well, no immediate complications    (including critical care time)  Medications Ordered in ED Medications - No data to display   Initial Impression / Assessment and Plan / ED Course  Etta Quill, NP has reviewed the triage vital signs and the nursing notes.  Pertinent  labs & imaging results that were available during my care of the patient were reviewed by me and considered in my medical decision making (see chart for details).  Clinical Course    Tetanus UTD. Laceration occurred < 12 hours prior to repair. Discussed laceration care with pt and answered questions. Pt to f-u for suture removal in 7 days and wound check sooner should there be signs of dehiscence or infection. Pt is hemodynamically stable with no complaints prior to dc.    No focal neurological deficits on physical exam.  Pt observed in the ED.  Discussed symptoms of post concussive syndrome and reasons to return to the emergency department including any new  severe headaches, disequilibrium, vomiting, double vision, extremity weakness, difficulty ambulating, or any other concerning symptoms. Patient will be discharged with information pertaining to diagnosis.  Pt is safe for discharge at this time.   Final Clinical Impressions(s) / ED Diagnoses   Final diagnoses:  Laceration of scalp, initial encounter    New Prescriptions New Prescriptions   No medications on file   I personally performed the services described in this documentation, which was scribed in my presence. The recorded information has been reviewed and is accurate.    Etta Quill, NP 01/13/16 [place], DO 01/17/16 [id number]

## 2016-01-13 NOTE — ED Notes (Signed)
EDNP at [facility] to staple scalp wound. Pt seen by EDNP prior to RN assessment, see NP notes, orders received and initiated.

## 2016-03-27 ENCOUNTER — Other Ambulatory Visit: Payer: Self-pay | Admitting: Internal Medicine

## 2016-03-27 DIAGNOSIS — Z1231 Encounter for screening mammogram for malignant neoplasm of breast: Secondary | ICD-10-CM

## 2016-04-14 ENCOUNTER — Ambulatory Visit
Admission: RE | Admit: 2016-04-14 | Discharge: 2016-04-14 | Disposition: A | Discharge status: Home / Self Care | Payer: Medicare Other | Source: Ambulatory Visit | Attending: Internal Medicine | Admitting: Internal Medicine

## 2016-04-14 DIAGNOSIS — Z1231 Encounter for screening mammogram for malignant neoplasm of breast: Secondary | ICD-10-CM

## 2016-05-17 ENCOUNTER — Ambulatory Visit (INDEPENDENT_AMBULATORY_CARE_PROVIDER_SITE_OTHER): Payer: Medicare Other | Admitting: Physician Assistant

## 2016-05-17 VITALS — BP 131/76 | HR 69 | Temp 98.1°F | Resp 16 | Ht 63.0 in | Wt 193.8 lb

## 2016-05-17 DIAGNOSIS — J020 Streptococcal pharyngitis: Secondary | ICD-10-CM

## 2016-05-17 LAB — POCT RAPID STREP A (OFFICE): Rapid Strep A Screen: POSITIVE — AB

## 2016-05-17 MED ORDER — BENZONATATE 100 MG PO CAPS: 100.0000 mg | ORAL_CAPSULE | Freq: Three times a day (TID) | ORAL | 0 refills | Status: DC | PRN

## 2016-05-17 MED ORDER — PENICILLIN G BENZATHINE 1200000 UNIT/2ML IM SUSP
1.2000 10*6.[IU] | Freq: Once | INTRAMUSCULAR | Status: AC
  Administered 2016-05-17: 1.2 10*6.[IU] via INTRAMUSCULAR

## 2016-05-17 MED ORDER — GUAIFENESIN ER 1200 MG PO TB12: 1.0000 | ORAL_TABLET | Freq: Two times a day (BID) | ORAL | 1 refills | Status: DC | PRN

## 2016-05-17 NOTE — Progress Notes (Signed)
THIS NOTE IS USED FOR EDUCATIONAL PURPOSES ONLY!!!   Name: [name]  DOB: 06-19-1945  Age: 71 y.o. Sex: female  CC:  Chief Complaint  Patient presents with  . Sore Throat  . Sinusitis    Right ear, Headache    PCP: GREEN, EDWIN JAY, MD  HPI: Pateint reports for sore throat and "white spots on my throat" x1 day.  Patient reports that her throat pain began x1 day ago. She reports that when she looks in the back of her throat she sees white spots. She reports HA and ear pain as well that began x1 day ago. She reports a history of season alleriges as well. She reports chest tightness. She has a history of seasonal allergies but reports this is a different feeling. She reports a non-productive cough. She denies congestion, sneezing, itchy eyes, watery eyes. Denies difficulty swallowing, reports odontophagia. She reports when this happens in the past she gets bronchitis and then eventually PNA and is seeking care early to avoid that. She denies fevers, chills, body aches.   She denies any sick contacts. She works in [employer] though.   ROS:  Constitutional: Negative for activity change, appetite change, fatigue and unexpected weight change.  HENT: Negative for congestion, dental problem, ear pain, hearing loss, mouth sores, postnasal drip, rhinorrhea, sneezing, sore throat, tinnitus and trouble swallowing.   Eyes: Negative for photophobia, pain, redness and visual disturbance.  Respiratory: Negative for cough, chest tightness and shortness of breath.   Cardiovascular: Negative for chest pain, palpitations and leg swelling.  Gastrointestinal: Negative for abdominal pain, blood in stool, constipation, diarrhea, nausea and vomiting.  Genitourinary: Negative for dysuria, frequency, hematuria and urgency.  Musculoskeletal: Negative for arthralgias, gait problem, myalgias and neck stiffness.  Skin: Negative for rash.  Neurological: Negative for dizziness, speech difficulty,  weakness, light-headedness, numbness and headaches.  Hematological: Negative for adenopathy.  Psychiatric/Behavioral: Negative for confusion and sleep disturbance. The patient is not nervous/anxious.    PMH:  Patient Active Problem List   Diagnosis Date Noted  . Asthma, chronic 04/18/2013  . HTN (hypertension) 04/18/2013    Allergies: No Known Allergies  Medications:  Current Outpatient Prescriptions on File Prior to Visit  Medication Sig Dispense Refill  . albuterol (PROVENTIL HFA;VENTOLIN HFA) 108 (90 BASE) MCG/ACT inhaler Inhale 2 puffs into the lungs every 6 (six) hours as needed.    Marland Kitchen atenolol (TENORMIN) 100 MG tablet Take 1 tablet by mouth Daily.    . clobetasol cream (TEMOVATE) 6.78 % Apply 1 application topically as needed. psoriasis    . clonazePAM (KLONOPIN) 0.5 MG tablet Take 0.5 mg by mouth before cath procedure. anxiety    . diclofenac sodium (VOLTAREN) 1 % GEL Apply 1 application topically daily.    Marland Kitchen doxazosin (CARDURA) 8 MG tablet Take 1 tablet by mouth Daily.    Marland Kitchen EPINEPHrine (EPIPEN JR) 0.15 MG/0.3ML injection Inject 0.15 mg into the muscle as needed.    . fluconazole (DIFLUCAN) 10 MG/ML suspension Take by mouth as directed. For fungal infection of nails    . FLUoxetine (PROZAC) 40 MG capsule Take 1 tablet by mouth Daily.    . fluticasone (FLONASE) 50 MCG/ACT nasal spray Place 2 sprays into the nose daily.    . hydrOXYzine (ATARAX/VISTARIL) 50 MG tablet Take 1 tablet by mouth At bedtime as needed. For sleep    . losartan (COZAAR) 100 MG tablet Take 50 mg by mouth Daily.    . mirtazapine (REMERON) 15 MG tablet  Take 1 tablet by mouth Daily. Monday -Friday only  Gradually tapering dose    . Omeprazole 20 MG TBEC Take 1 tablet by mouth Daily.    . salmeterol (SEREVENT) 50 MCG/DOSE diskus inhaler Inhale 1 puff into the lungs daily.    . traMADol-acetaminophen (ULTRACET) 37.5-325 MG per tablet Take 2 tablets by mouth as needed. pain     No current facility-administered  medications on file prior to visit.     PE:  GS: WDWN female sitting on exam table in NAD.  Vitals: BP 131/76   Pulse 69   Temp 98.1 F (36.7 C) (Oral)   Resp 16   Ht 5\' 3"  (1.6 m)   Wt 193 lb 12.8 oz (87.9 kg)   SpO2 98%   BMI 34.33 kg/m  HEENT: Normocephalic, atruamatic. PEARRL. No cervical lymphadenopathy. No thyroid nodules, normal size, and equal bilaterally. Ears: Bilateral ears without erythema, TM clear with good coen of light. TM without bulging. Nose: Patent, without erythema or discharge. Mouth/Throat: Moist mucous membranes. No erythema. Tonsils without erythema or tonsillar exudate.  Cardiovascular: RRR. No S3 or S4. No murmurs, rubs, or gallops. Pulses 2+ and equal bilateral in the upper and lower extremities. No pitting edema. No varicosities, clubbing, or cyanosis.  Pulm: CTA bilaterally. No expiratory muscle use while breathing.  GI: +BS. NTND. No rigidity or guarding. No rebound tenderness.  Neuro: CN 2-12 grossly intact.  Psych: A&O x 4. Mood and affect appropriate for situation.  Skin: Warm and dry. No rashes or excoriations on exposed skin.   A&P:  Strep pharyngitis -  Plan:  -Labs: POCT rapid strep A- positive -Pharm:  benzonatate (TESSALON) 100 MG capsule, Guaifenesin (MUCINEX MAXIMUM STRENGTH) 1200 MG TB12, penicillin g benzathine (BICILLIN LA) 1200000 UNIT/2ML injection 1.2 Million Units, Care order/instruction:, CANCELED: Culture, Group A Strep       Respectfully,  Delilah Shan, PA-S2

## 2016-05-17 NOTE — Patient Instructions (Addendum)
   Strep Throat Strep throat is an infection of the throat. It is caused by germs. Strep throat spreads from person to person because of coughing, sneezing, or close contact. Follow these instructions at home: Medicines   Take over-the-counter and prescription medicines only as told by your doctor.  Take your antibiotic medicine as told by your doctor. Do not stop taking the medicine even if you feel better.  Have family members who also have a sore throat or fever go to a doctor. Eating and drinking   Do not share food, drinking cups, or personal items.  Try eating soft foods until your sore throat feels better.  Drink enough fluid to keep your pee (urine) clear or pale yellow. General instructions   Rinse your mouth (gargle) with a salt-water mixture 3-4 times per day or as needed. To make a salt-water mixture, stir -1 tsp of salt into 1 cup of warm water.  Make sure that all people in your house wash their hands well.  Rest.  Stay home from school or work until you have been taking antibiotics for 24 hours.  Keep all follow-up visits as told by your doctor. This is important. Contact a doctor if:  Your neck keeps getting bigger.  You get a rash, cough, or earache.  You cough up thick liquid that is green, yellow-brown, or bloody.  You have pain that does not get better with medicine.  Your problems get worse instead of getting better.  You have a fever. Get help right away if:  You throw up (vomit).  You get a very bad headache.  You neck hurts or it feels stiff.  You have chest pain or you are short of breath.  You have drooling, very bad throat pain, or changes in your voice.  Your neck is swollen or the skin gets red and tender.  Your mouth is dry or you are peeing less than normal.  You keep feeling more tired or it is hard to wake up.  Your joints are red or they hurt. This information is not intended to replace advice given to you by your health  care provider. Make sure you discuss any questions you have with your health care provider. Document Released: 07/05/2007 Document Revised: 09/15/2015 Document Reviewed: 05/11/2014 Elsevier Interactive Patient Education  2017 [employer].    IF you received an x-ray today, you will receive an invoice from [facility]. Please contact [facility] at [phone] with questions or concerns regarding your invoice.   IF you received labwork today, you will receive an invoice from [facility]. Please contact [facility] at [phone] with questions or concerns regarding your invoice.   Our billing staff will not be able to assist you with questions regarding bills from these companies.  You will be contacted with the lab results as soon as they are available. The fastest way to get your results is to activate your My Chart account. Instructions are located on the last page of this paperwork. If you have not heard from [place] regarding the results in 2 weeks, please contact this office.

## 2016-05-17 NOTE — Progress Notes (Signed)
PCP: Criselda Peaches, MD  Chief Complaint  Patient presents with  . Sore Throat  . Sinusitis    Right ear, Headache    History of Present Illness: Patient presents for evaluation of sore throat and RIGHT ear pain and HA. Her PCP is not in the office today, so she presents here.  Her symptoms began yesterday. This feels different than her typical seasonal allergies. Some chest tightness, non-productive cough. No congestion, sneezing, itchy/watery eyes. No fever, chills, body aches. She is concerned because when she gets "this" she often develops bronchitis and then pneumonia.  Potential sick contacts at [employer], but none known specifically..  ROS: Constitutional: Negative for activity change, appetite change, fatigueand unexpected weight change.  HENT: Negative for congestion, dental problem, ear pain, hearing loss, mouth sores, postnasal drip, rhinorrhea, sneezing, sore throat, tinnitusand trouble swallowing.  Eyes: Negative for photophobia, pain, rednessand visual disturbance.  Respiratory: Negative for cough, chest tightnessand shortness of breath.  Cardiovascular: Negative for chest pain, palpitationsand leg swelling.  Gastrointestinal: Negative for abdominal pain, blood in stool, constipation, diarrhea, nauseaand vomiting.  Genitourinary: Negative for dysuria, frequency, hematuriaand urgency.  Musculoskeletal: Negative for arthralgias, gait problem, myalgiasand neck stiffness.  Skin: Negative for rash.  Neurological: Negative for dizziness, speech difficulty, weakness, light-headedness, numbnessand headaches.  Hematological: Negative for adenopathy.  Psychiatric/Behavioral: Negative for confusionand sleep disturbance. The patient is not nervous/anxious.      No Known Allergies  Prior to Admission medications   Medication Sig Start Date End Date Taking? Authorizing Provider  albuterol (PROVENTIL HFA;VENTOLIN HFA) 108 (90 BASE) MCG/ACT inhaler  Inhale 2 puffs into the lungs every 6 (six) hours as needed.   Yes Historical Provider, MD  atenolol (TENORMIN) 100 MG tablet Take 1 tablet by mouth Daily. 12/12/11  Yes Historical Provider, MD  clobetasol cream (TEMOVATE) 9.35 % Apply 1 application topically as needed. psoriasis   Yes Historical Provider, MD  clonazePAM (KLONOPIN) 0.5 MG tablet Take 0.5 mg by mouth before cath procedure. anxiety   Yes Historical Provider, MD  diclofenac sodium (VOLTAREN) 1 % GEL Apply 1 application topically daily.   Yes Historical Provider, MD  doxazosin (CARDURA) 8 MG tablet Take 1 tablet by mouth Daily. 10/26/11  Yes Historical Provider, MD  EPINEPHrine (EPIPEN JR) 0.15 MG/0.3ML injection Inject 0.15 mg into the muscle as needed.   Yes Historical Provider, MD  fluconazole (DIFLUCAN) 10 MG/ML suspension Take by mouth as directed. For fungal infection of nails   Yes Historical Provider, MD  FLUoxetine (PROZAC) 40 MG capsule Take 1 tablet by mouth Daily. 12/05/11  Yes Historical Provider, MD  fluticasone (FLONASE) 50 MCG/ACT nasal spray Place 2 sprays into the nose daily.   Yes Historical Provider, MD  hydrOXYzine (ATARAX/VISTARIL) 50 MG tablet Take 1 tablet by mouth At bedtime as needed. For sleep 10/04/11  Yes Historical Provider, MD  losartan (COZAAR) 100 MG tablet Take 50 mg by mouth Daily. 12/05/11  Yes Historical Provider, MD  Omeprazole 20 MG TBEC Take 1 tablet by mouth Daily. 10/03/11  Yes Historical Provider, MD  salmeterol (SEREVENT) 50 MCG/DOSE diskus inhaler Inhale 1 puff into the lungs daily.   Yes Historical Provider, MD  traMADol-acetaminophen (ULTRACET) 37.5-325 MG per tablet Take 2 tablets by mouth as needed. pain 12/12/11  Yes Historical Provider, MD  amLODipine (NORVASC) 5 MG tablet TK 1 T PO QD 04/02/16   Historical Provider, MD  furosemide (LASIX) 40 MG tablet TK 1 T PO ONCE D 02/24/16   Historical  Provider, MD  traZODone (DESYREL) 50 MG tablet  04/02/16   Historical Provider, MD    Patient Active  Problem List   Diagnosis Date Noted  . Asthma, chronic 04/18/2013  . HTN (hypertension) 04/18/2013     Physical Exam  Constitutional: She is oriented to person, place, and time. She appears well-developed and well-nourished. She is active and cooperative. No distress.  BP 131/76   Pulse 69   Temp 98.1 F (36.7 C) (Oral)   Resp 16   Ht 5\' 3"  (1.6 m)   Wt 193 lb 12.8 oz (87.9 kg)   SpO2 98%   BMI 34.33 kg/m   HENT:  Head: Normocephalic and atraumatic.  Right Ear: Hearing, tympanic membrane, external ear and ear canal normal.  Left Ear: Hearing, tympanic membrane, external ear and ear canal normal.  Nose: Nose normal. Right sinus exhibits no maxillary sinus tenderness and no frontal sinus tenderness. Left sinus exhibits no maxillary sinus tenderness and no frontal sinus tenderness.  Mouth/Throat: Uvula is midline, oropharynx is clear and moist and mucous membranes are normal. No oral lesions. No uvula swelling.  Eyes: Conjunctivae are normal. No scleral icterus.  Neck: Normal range of motion. Neck supple. No thyromegaly present.  Cardiovascular: Normal rate, regular rhythm and normal heart sounds.   Pulses:      Radial pulses are 2+ on the right side, and 2+ on the left side.  Pulmonary/Chest: Effort normal and breath sounds normal.  Lymphadenopathy:       Head (right side): No tonsillar, no preauricular, no posterior auricular and no occipital adenopathy present.       Head (left side): No tonsillar, no preauricular, no posterior auricular and no occipital adenopathy present.    She has no cervical adenopathy.       Right: No supraclavicular adenopathy present.       Left: No supraclavicular adenopathy present.  Neurological: She is alert and oriented to person, place, and time. No sensory deficit.  Skin: Skin is warm, dry and intact. No rash noted. No cyanosis or erythema. Nails show no clubbing.  Psychiatric: She has a normal mood and affect. Her speech is normal and behavior  is normal.   Results for orders placed or performed in visit on 05/17/16  POCT rapid strep A  Result Value Ref Range   Rapid Strep A Screen Positive (A) Negative      ASSESSMENT & PLAN:  1. Strep pharyngitis Bicillin IM here. Supportive care. Anticipatory guidance provided. - POCT rapid strep A - benzonatate (TESSALON) 100 MG capsule; Take 1-2 capsules (100-200 mg total) by mouth 3 (three) times daily as needed for cough.  Dispense: 40 capsule; Refill: 0 - Guaifenesin (MUCINEX MAXIMUM STRENGTH) 1200 MG TB12; Take 1 tablet (1,200 mg total) by mouth every 12 (twelve) hours as needed.  Dispense: 14 tablet; Refill: 1 - penicillin g benzathine (BICILLIN LA) 1200000 UNIT/2ML injection 1.2 Million Units; Inject 2 mLs (1.2 Million Units total) into the muscle once. - Care order/instruction:    Return if symptoms worsen or fail to improve.   Fara Chute, PA-C [facility]

## 2016-12-26 DIAGNOSIS — D229 Melanocytic nevi, unspecified: Secondary | ICD-10-CM

## 2016-12-26 HISTORY — DX: Melanocytic nevi, unspecified: D22.9

## 2017-03-06 ENCOUNTER — Other Ambulatory Visit: Payer: Self-pay | Admitting: Internal Medicine

## 2017-03-06 DIAGNOSIS — Z1231 Encounter for screening mammogram for malignant neoplasm of breast: Secondary | ICD-10-CM

## 2017-04-16 ENCOUNTER — Ambulatory Visit
Admission: RE | Admit: 2017-04-16 | Discharge: 2017-04-16 | Disposition: A | Discharge status: Home / Self Care | Payer: Medicare Other | Source: Ambulatory Visit | Attending: Internal Medicine | Admitting: Internal Medicine

## 2017-04-16 DIAGNOSIS — Z1231 Encounter for screening mammogram for malignant neoplasm of breast: Secondary | ICD-10-CM

## 2018-03-10 ENCOUNTER — Encounter (HOSPITAL_BASED_OUTPATIENT_CLINIC_OR_DEPARTMENT_OTHER): Payer: Self-pay | Admitting: *Deleted

## 2018-03-10 ENCOUNTER — Emergency Department (HOSPITAL_BASED_OUTPATIENT_CLINIC_OR_DEPARTMENT_OTHER)
Admission: EM | Admit: 2018-03-10 | Discharge: 2018-03-10 | Disposition: A | Discharge status: Home / Self Care | Payer: Medicare Other | Attending: Emergency Medicine | Admitting: Emergency Medicine

## 2018-03-10 ENCOUNTER — Other Ambulatory Visit: Payer: Self-pay

## 2018-03-10 ENCOUNTER — Emergency Department (HOSPITAL_BASED_OUTPATIENT_CLINIC_OR_DEPARTMENT_OTHER): Payer: Medicare Other

## 2018-03-10 DIAGNOSIS — Z79899 Other long term (current) drug therapy: Secondary | ICD-10-CM | POA: Diagnosis not present

## 2018-03-10 DIAGNOSIS — Y939 Activity, unspecified: Secondary | ICD-10-CM | POA: Diagnosis not present

## 2018-03-10 DIAGNOSIS — W19XXXA Unspecified fall, initial encounter: Secondary | ICD-10-CM

## 2018-03-10 DIAGNOSIS — I10 Essential (primary) hypertension: Secondary | ICD-10-CM | POA: Insufficient documentation

## 2018-03-10 DIAGNOSIS — J45909 Unspecified asthma, uncomplicated: Secondary | ICD-10-CM | POA: Insufficient documentation

## 2018-03-10 DIAGNOSIS — Z96653 Presence of artificial knee joint, bilateral: Secondary | ICD-10-CM | POA: Insufficient documentation

## 2018-03-10 DIAGNOSIS — S63612A Unspecified sprain of right middle finger, initial encounter: Secondary | ICD-10-CM | POA: Insufficient documentation

## 2018-03-10 DIAGNOSIS — S0992XA Unspecified injury of nose, initial encounter: Secondary | ICD-10-CM | POA: Diagnosis present

## 2018-03-10 DIAGNOSIS — S0031XA Abrasion of nose, initial encounter: Secondary | ICD-10-CM | POA: Diagnosis not present

## 2018-03-10 DIAGNOSIS — S0993XA Unspecified injury of face, initial encounter: Secondary | ICD-10-CM | POA: Insufficient documentation

## 2018-03-10 DIAGNOSIS — W0110XA Fall on same level from slipping, tripping and stumbling with subsequent striking against unspecified object, initial encounter: Secondary | ICD-10-CM | POA: Insufficient documentation

## 2018-03-10 DIAGNOSIS — T148XXA Other injury of unspecified body region, initial encounter: Secondary | ICD-10-CM

## 2018-03-10 DIAGNOSIS — Y92007 Garden or yard of unspecified non-institutional (private) residence as the place of occurrence of the external cause: Secondary | ICD-10-CM | POA: Diagnosis not present

## 2018-03-10 DIAGNOSIS — Y999 Unspecified external cause status: Secondary | ICD-10-CM | POA: Diagnosis not present

## 2018-03-10 NOTE — ED Triage Notes (Signed)
Pt reports she fell down outside. C/o pain across bridge of nose. Abrasion noted with dried blood. C/o pain in right middle finger. Denies LOC. Also c/o pain to upper back

## 2018-03-10 NOTE — ED Notes (Signed)
Patient left at this time with all belongings, refused wheelchair to lobby.

## 2018-03-10 NOTE — Discharge Instructions (Signed)
You can take Tylenol or Ibuprofen as directed for pain. You can alternate Tylenol and Ibuprofen every 4 hours. If you take Tylenol at 1pm, then you can take Ibuprofen at 5pm. Then you can take Tylenol again at 9pm.   Keep the wound on your nose clean and dry.  Follow-up with your primary care doctor.  Return the emergency department for any chest pain, difficulty breathing, difficulty walking, noted/weakness of arms or legs or any other worsening or concerning symptoms.

## 2018-03-10 NOTE — ED Provider Notes (Signed)
[facility] EMERGENCY DEPARTMENT Provider Note   CSN: [id number] Arrival date & time: 03/10/18  1950     History   Chief Complaint Chief Complaint  Patient presents with  . Fall    HPI [name] is a 73 y.o. female presents for evaluation after a fall that occurred just prior to ED arrival.  Patient reports she was walking outside and states she was looking up at the moon.  She thinks that she may have hit uneven driveway which caused her to fall forward.  She states that she landed on her face.  She states no LOC.  She was able to get up and walk into the house.  Patient states that she is not currently on blood thinners.  On ED arrival, patient complains of some pain to her nose, neck, right third digit, left foot.  Patient states that she has been able to ambulate since the incident.  She denies any preceding chest pain or dizziness.  Patient denies any vision changes, difficulty breathing, numbness/weakness of arms or legs, vision changes.  The history is provided by the patient.    Past Medical History:  Diagnosis Date  . Allergy    Hay fever/bee stings  . Anxiety   . Arthritis   . Asthma   . Blood transfusion without reported diagnosis 2012  . Cataract   . Depression   . GERD (gastroesophageal reflux disease)   . Heart murmur   . Hypertension     Patient Active Problem List   Diagnosis Date Noted  . Asthma, chronic 04/18/2013  . HTN (hypertension) 04/18/2013    Past Surgical History:  Procedure Laterality Date  . ABDOMINAL HYSTERECTOMY  1986  . MELANOMA EXCISION  2009   left shoulder near neck  . REPLACEMENT TOTAL KNEE BILATERAL  05/2010 &05/2010     OB History   No obstetric history on file.      Home Medications    Prior to Admission medications   Medication Sig Start Date End Date Taking? Authorizing Provider  albuterol (PROVENTIL HFA;VENTOLIN HFA) 108 (90 BASE) MCG/ACT inhaler Inhale 2 puffs into the lungs every 6 (six) hours as  needed.    [provider]  atenolol (TENORMIN) 100 MG tablet Take 1 tablet by mouth Daily. 12/12/11   [provider]  benzonatate (TESSALON) 100 MG capsule Take 1-2 capsules (100-200 mg total) by mouth 3 (three) times daily as needed for cough. 05/17/16   Harrison Mons, PA  clobetasol cream (TEMOVATE) 9.38 % Apply 1 application topically as needed. psoriasis    [provider]  clonazePAM (KLONOPIN) 0.5 MG tablet Take 0.5 mg by mouth before cath procedure. anxiety    [provider]  diclofenac sodium (VOLTAREN) 1 % GEL Apply 1 application topically daily.    [provider]  doxazosin (CARDURA) 8 MG tablet Take 1 tablet by mouth Daily. 10/26/11   [provider]  EPINEPHrine (EPIPEN JR) 0.15 MG/0.3ML injection Inject 0.15 mg into the muscle as needed.    [provider]  fluconazole (DIFLUCAN) 10 MG/ML suspension Take by mouth as directed. For fungal infection of nails    [provider]  FLUoxetine (PROZAC) 40 MG capsule Take 1 tablet by mouth Daily. 12/05/11   [provider]  fluticasone (FLONASE) 50 MCG/ACT nasal spray Place 2 sprays into the nose daily.    [provider]  furosemide (LASIX) 40 MG tablet TK 1 T PO ONCE D 02/24/16   [provider]  Guaifenesin (MUCINEX MAXIMUM STRENGTH) 1200 MG TB12 Take 1 tablet (1,200 mg total) by mouth every 12 (twelve) hours as needed. 05/17/16   Harrison Mons, PA  hydrOXYzine (ATARAX/VISTARIL) 50 MG tablet Take 1 tablet by mouth At bedtime as needed. For sleep 10/04/11   [provider]  losartan (COZAAR) 100 MG tablet Take 50 mg by mouth Daily. 12/05/11   [provider]  mirtazapine (REMERON) 15 MG tablet Take 1 tablet by mouth Daily. Monday -Friday only  Gradually tapering dose 12/12/11   [provider]  Omeprazole 20 MG TBEC Take 1 tablet by mouth Daily. 10/03/11   [provider]  traMADol-acetaminophen (ULTRACET)  37.5-325 MG per tablet Take 2 tablets by mouth as needed. pain 12/12/11   [provider]  traZODone (DESYREL) 50 MG tablet  04/02/16   [provider]    Family History No family history on file.  Social History Social History   Tobacco Use  . Smoking status: Never Smoker  . Smokeless tobacco: Never Used  Substance Use Topics  . Alcohol use: No  . Drug use: No     Allergies   Patient has no known allergies.   Review of Systems Review of Systems  Constitutional: Negative for fever.  HENT:       Facial pain  Eyes: Negative for visual disturbance.  Respiratory: Negative for shortness of breath.   Cardiovascular: Negative for chest pain.  Gastrointestinal: Negative for abdominal pain, nausea and vomiting.  Genitourinary: Negative for dysuria and hematuria.  Musculoskeletal: Positive for neck pain.       Right third digit pain Left ankle pain  Neurological: Negative for weakness, numbness and headaches.  All other systems reviewed and are negative.    Physical Exam Updated Vital Signs BP 135/78   Pulse 66   Temp 98.1 F (36.7 C) (Oral)   Resp 18   Ht 5\' 3"  (1.6 m)   Wt 86.2 kg   SpO2 99%   BMI 33.66 kg/m   Physical Exam Vitals signs and nursing note reviewed.  Constitutional:      Appearance: Normal appearance. She is well-developed.  HENT:     Head: Normocephalic and atraumatic.     Comments: No tenderness to palpation of skull. No deformities or crepitus noted. No open wounds, abrasions or lacerations.  No tenderness palpation of the mandible.    Nose: Nasal tenderness present.     Right Nostril: No epistaxis or septal hematoma.     Left Nostril: No epistaxis or septal hematoma.      Comments: Mild swelling noted to the nasal bridge.  There is an overlying abrasion.  No septal hematoma.  No epistaxis. Eyes:     General: Lids are normal.     Extraocular Movements: Extraocular movements intact.     Conjunctiva/sclera: Conjunctivae  normal.     Pupils: Pupils are equal, round, and reactive to light.     Comments: PERRL. EOMs intact without any difficulty.   Neck:     Musculoskeletal: Normal range of motion.     Comments: Full flexion/extension and lateral movement of neck fully intact.  Midline tenderness noted to the cervical region at approximately C4-C5 level.  No deformities or crepitus.  Cardiovascular:     Rate and Rhythm: Normal rate and regular rhythm.     Pulses: Normal pulses.     Heart sounds: Normal heart sounds. No murmur. No friction rub. No gallop.   Pulmonary:  Effort: Pulmonary effort is normal.     Breath sounds: Normal breath sounds.  Abdominal:     Palpations: Abdomen is soft. Abdomen is not rigid.     Tenderness: There is no abdominal tenderness. There is no guarding.  Musculoskeletal: Normal range of motion.     Comments: Tenderness to palpation to the PIP joint of the right third digit with some overlying soft tissue swelling and ecchymosis.  Flexion/extension intact.  No deformity or crepitus noted.  No tenderness palpation of the right wrist, right elbow, right shoulder.  No tenderness palpation of the left upper extremity.  Mild tenderness palpation of the lateral malleolus of the left ankle.  No overlying soft tissue swelling, ecchymosis.  No deformity or crepitus noted.  Dorsiflexion plantarflexion of bilateral feet intact on difficulty.  No tenderness palpation noted to left knee, left hip.  No tenderness palpation noted to right lower extremity.  Skin:    General: Skin is warm and dry.     Capillary Refill: Capillary refill takes less than 2 seconds.     Comments: Small abrasion overlying nose.  Neurological:     Mental Status: She is alert and oriented to person, place, and time.     Comments: Cranial nerves III-XII intact Follows commands, Moves all extremities  5/5 strength to BUE and BLE  Sensation intact throughout all major nerve distributions Normal coordination No pronator  drift. No gait abnormalities  No slurred speech. No facial droop.   Psychiatric:        Speech: Speech normal.      ED Treatments / Results  Labs (all labs ordered are listed, but only abnormal results are displayed) Labs Reviewed - No data to display  EKG None  Radiology Dg Ankle Complete Left  Result Date: 03/10/2018 CLINICAL DATA:  Ankle soreness post fall EXAM: LEFT ANKLE COMPLETE - 3+ VIEW COMPARISON:  None FINDINGS: Osseous demineralization. Ankle mortise intact. Small corticated ossicle identified adjacent to tip of lateral malleolus, appears old. Minimal spurring at tip of medial malleolus. Plantar calcaneal spur. No acute fracture, dislocation, or bone destruction. IMPRESSION: No acute osseous abnormalities. Electronically Signed   By: Lavonia Dana M.D.   On: 03/10/2018 21:05   Ct Head Wo Contrast  Result Date: 03/10/2018 CLINICAL DATA:  Fall EXAM: CT HEAD WITHOUT CONTRAST CT CERVICAL SPINE WITHOUT CONTRAST TECHNIQUE: Multidetector CT imaging of the head and cervical spine was performed following the standard protocol without intravenous contrast. Multiplanar CT image reconstructions of the cervical spine were also generated. COMPARISON:  None. FINDINGS: CT HEAD FINDINGS Brain: No acute intracranial abnormality. Specifically, no hemorrhage, hydrocephalus, mass lesion, acute infarction, or significant intracranial injury. Vascular: No hyperdense vessel or unexpected calcification. Skull: No acute calvarial abnormality. Sinuses/Orbits: Visualized paranasal sinuses and mastoids clear. Orbital soft tissues unremarkable. Other: None CT CERVICAL SPINE FINDINGS Alignment: Slight anterolisthesis of C4 on C5 related to facet disease. Skull base and vertebrae: No acute fracture. No primary bone lesion or focal pathologic process. Soft tissues and spinal canal: No prevertebral fluid or swelling. No visible canal hematoma. Disc levels: Degenerative disc disease most pronounced at C5-6 with disc  space narrowing and spurring. Diffuse degenerative facet disease. Upper chest: No acute findings Other: None IMPRESSION: No acute intracranial abnormality. 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 No acute bony abnormality. Electronically Signed   By: Rolm Baptise M.D.   On: 03/10/2018 20:50   Ct Cervical Spine Wo Contrast  Result Date: 03/10/2018 CLINICAL DATA:  Fall EXAM:  CT HEAD WITHOUT CONTRAST CT CERVICAL SPINE WITHOUT CONTRAST TECHNIQUE: Multidetector CT imaging of the head and cervical spine was performed following the standard protocol without intravenous contrast. Multiplanar CT image reconstructions of the cervical spine were also generated. COMPARISON:  None. FINDINGS: CT HEAD FINDINGS Brain: No acute intracranial abnormality. Specifically, no hemorrhage, hydrocephalus, mass lesion, acute infarction, or significant intracranial injury. Vascular: No hyperdense vessel or unexpected calcification. Skull: No acute calvarial abnormality. Sinuses/Orbits: Visualized paranasal sinuses and mastoids clear. Orbital soft tissues unremarkable. Other: None CT CERVICAL SPINE FINDINGS Alignment: Slight anterolisthesis of C4 on C5 related to facet disease. Skull base and vertebrae: No acute fracture. No primary bone lesion or focal pathologic process. Soft tissues and spinal canal: No prevertebral fluid or swelling. No visible canal hematoma. Disc levels: Degenerative disc disease most pronounced at C5-6 with disc space narrowing and spurring. Diffuse degenerative facet disease. Upper chest: No acute findings Other: None IMPRESSION: No acute intracranial abnormality. 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 No acute bony abnormality. Electronically Signed   By: Rolm Baptise M.D.   On: 03/10/2018 20:50   Dg Finger Middle Right  Result Date: 03/10/2018 CLINICAL DATA:  RIGHT middle finger pain post fall EXAM: RIGHT MIDDLE FINGER 2+V COMPARISON:  RIGHT hand radiograph 06/08/2011  FINDINGS: Osseous demineralization. Degenerative changes of the DIP and PIP joints of the RIGHT middle finger. No acute fracture, dislocation, or bone destruction. IMPRESSION: No acute osseous abnormalities. Degenerative changes of the PIP and DIP joint RIGHT middle finger. Electronically Signed   By: Lavonia Dana M.D.   On: 03/10/2018 21:08    Procedures Procedures (including critical care time)  Medications Ordered in ED Medications - No data to display   Initial Impression / Assessment and Plan / ED Course  I have reviewed the triage vital signs and the nursing notes.  Pertinent labs & imaging results that were available during my care of the patient were reviewed by me and considered in my medical decision making (see chart for details).     73 year old female who presents for evaluation after mechanical fall.  Patient reports she was walking outside looking at the moon when she thinks that she tripped over some uneven sidewalk.  No preceding chest pain, dizziness.  No head injury, LOC.  She is not on blood thinners.  On ED arrival, she reports of neck pain, facial pain, pain to right third digit and left foot. `Patient is afebrile, non-toxic appearing, sitting comfortably on examination table. Vital signs reviewed and stable. No neuro deficits noted on exam.  Plan to check imaging for evaluation of any fracture or bony abnormality.  Plan for x-ray of foot and right third digit for evaluation of any acute bony abnormality.  Finger XR shows no evidence of bony abnormality.  X-ray of ankle negative for any acute abnormality.  CT head negative for any acute abnormality.  CT cervical spine negative for any acute bony abnormality.  Discussed results with patient.  Reevaluation after wound care shows abrasion with no evidence of laceration that would need to be repaired. At this time, patient exhibits no emergent life-threatening condition that require further evaluation in ED or admission. Patient  had ample opportunity for questions and discussion. All patient's questions were answered with full understanding. Strict return precautions discussed. Patient expresses understanding and agreement to plan.   Portions of this note were generated with [employer]. Dictation errors may occur despite best attempts at proofreading.   Final Clinical Impressions(s) /  ED Diagnoses   Final diagnoses:  Fall, initial encounter  Abrasion  Sprain of right middle finger, unspecified site of finger, initial encounter    ED Discharge Orders    None       Desma Mcgregor 03/10/18 [place], [place], DO 03/10/18 [id number]

## 2018-05-22 ENCOUNTER — Other Ambulatory Visit: Payer: Self-pay | Admitting: Internal Medicine

## 2018-05-22 DIAGNOSIS — Z1231 Encounter for screening mammogram for malignant neoplasm of breast: Secondary | ICD-10-CM

## 2018-07-15 ENCOUNTER — Other Ambulatory Visit: Payer: Self-pay | Admitting: Internal Medicine

## 2018-07-15 DIAGNOSIS — M810 Age-related osteoporosis without current pathological fracture: Secondary | ICD-10-CM

## 2018-07-18 ENCOUNTER — Other Ambulatory Visit: Payer: Self-pay

## 2018-07-18 ENCOUNTER — Ambulatory Visit
Admission: RE | Admit: 2018-07-18 | Discharge: 2018-07-18 | Disposition: A | Discharge status: Home / Self Care | Payer: Medicare Other | Source: Ambulatory Visit | Attending: Internal Medicine | Admitting: Internal Medicine

## 2018-07-18 DIAGNOSIS — Z1231 Encounter for screening mammogram for malignant neoplasm of breast: Secondary | ICD-10-CM

## 2018-09-20 ENCOUNTER — Other Ambulatory Visit: Payer: Self-pay | Admitting: Internal Medicine

## 2018-09-20 DIAGNOSIS — M8588 Other specified disorders of bone density and structure, other site: Secondary | ICD-10-CM

## 2018-09-20 DIAGNOSIS — E2839 Other primary ovarian failure: Secondary | ICD-10-CM

## 2018-09-24 ENCOUNTER — Ambulatory Visit
Admission: RE | Admit: 2018-09-24 | Discharge: 2018-09-24 | Disposition: A | Discharge status: Home / Self Care | Payer: Medicare Other | Source: Ambulatory Visit | Attending: Internal Medicine | Admitting: Internal Medicine

## 2018-09-24 ENCOUNTER — Other Ambulatory Visit: Payer: Self-pay

## 2018-09-24 DIAGNOSIS — M8588 Other specified disorders of bone density and structure, other site: Secondary | ICD-10-CM

## 2018-09-24 DIAGNOSIS — E2839 Other primary ovarian failure: Secondary | ICD-10-CM

## 2019-05-23 ENCOUNTER — Ambulatory Visit: Payer: Medicare PPO | Attending: Internal Medicine | Admitting: Rehabilitative and Restorative Service Providers"

## 2019-05-23 ENCOUNTER — Other Ambulatory Visit: Payer: Self-pay

## 2019-05-23 ENCOUNTER — Encounter: Payer: Self-pay | Admitting: Rehabilitative and Restorative Service Providers"

## 2019-05-23 DIAGNOSIS — R262 Difficulty in walking, not elsewhere classified: Secondary | ICD-10-CM

## 2019-05-23 DIAGNOSIS — Z9181 History of falling: Secondary | ICD-10-CM | POA: Insufficient documentation

## 2019-05-23 DIAGNOSIS — M6281 Muscle weakness (generalized): Secondary | ICD-10-CM

## 2019-05-23 DIAGNOSIS — R2681 Unsteadiness on feet: Secondary | ICD-10-CM | POA: Insufficient documentation

## 2019-05-23 DIAGNOSIS — R2689 Other abnormalities of gait and mobility: Secondary | ICD-10-CM

## 2019-05-23 NOTE — Therapy (Signed)
[facility] [address] [place], [place], [address] Phone: [phone]   Fax:  [phone]  Physical Therapy Evaluation  Patient Details  Name: [name] MRN: [id number] Date of Birth: 09/11/1945 Referring Provider (PT): Hauser [place]   Encounter Date: 05/23/2019  PT End of Session - 05/23/19 1226    Visit Number  1    Number of Visits  17    Date for PT Re-Evaluation  07/22/19   60 day POC written, 90 day [place] [employer]   10th visit progress note   PT Start Time  1018    PT Stop Time  1056    PT Time Calculation (min)  38 min    Equipment Utilized During Treatment  Gait belt    Activity Tolerance  Patient tolerated treatment well    Behavior During Therapy  WFL for tasks assessed/performed       Past Medical History:  Diagnosis Date  . Allergy    Hay fever/bee stings  . Anxiety   . Arthritis   . Asthma   . Blood transfusion without reported diagnosis 2012  . Cataract   . Depression   . GERD (gastroesophageal reflux disease)   . Heart murmur   . Hypertension     Past Surgical History:  Procedure Laterality Date  . ABDOMINAL HYSTERECTOMY  1986  . MELANOMA EXCISION  2009   left shoulder near neck  . REPLACEMENT TOTAL KNEE BILATERAL  05/2010 &05/2010    There were no vitals filed for this visit.   Subjective Assessment - 05/23/19 1023    Subjective  Patient reports that she has been feeling "off" with her balance and steadiness for about 6 months. Recently I've noticed that toes feel crampy and tingling sometimes. It feels like my feet aren't picking up fully. I do Silver Sneakers (before COVID-19 shut them down). I then started to walk with a friend for 2-3 miles.    Pertinent History  hx significant for falls, heart murmur, s/p B TKAs (most recent in 2012), spinal stenosis    Limitations  Standing;Walking;Lifting;House hold activities    How long can you walk  comfortably?  2-3 miles - I walk this with a friend 2x/wk    Patient Stated Goals  I want to get my balance back; I want to be steadier on my feet and not worry about falling down    Currently in Pain?  No/denies         [facility] PT Assessment - 05/23/19 1028      Assessment   Medical Diagnosis  Gait Abnormality    Referring Provider (PT)  Caprice Beaver PA    Onset Date/Surgical Date  05/14/19   referral date; imbalance subjectively reported for 6 months   Prior Therapy  previous PT following TKAs      Precautions   Precautions  Fall      Balance Screen   Has the patient fallen in the past 6 months  Yes    How many times?  0   0 "full falls"; >6 very near falls   Has the patient had a decrease in activity level because of a fear of falling?   Yes    Is the patient reluctant to leave their home because of a fear of falling?   No      Home Social worker  Private residence    Living Arrangements  Alone  Available Help at Discharge  Family   lives nearby    Type of [place] to enter    Entrance Stairs-Number of Steps  1 step to enter     Entrance Stairs-Rails  None   can reach [place]  One level    [place] - single point;Grab bars - tub/shower    Additional Comments  2 cats at home      Prior Function   Level of [place]  Retired    Leisure  I enjoy walking, exercising Paramedic), go to [employer], reading      Cognition   Overall Cognitive Status  Within Functional Limits for tasks assessed      Sensation   Light Touch  Appears Intact      ROM / Strength   AROM / PROM / Strength  Strength      Strength   Strength Assessment Site  Hip;Knee;Ankle    Right/Left Hip  Right;Left    Right Hip Flexion  4-/5    Right Hip ABduction  4-/5    Left Hip Flexion  5/5    Left Hip ABduction  4/5    Right/Left Knee  Right;Left    Right Knee Flexion  5/5    Right Knee  Extension  5/5    Left Knee Flexion  5/5    Left Knee Extension  5/5    Right/Left Ankle  Right;Left    Right Ankle Dorsiflexion  5/5   within available ROM    Left Ankle Dorsiflexion  5/5   within available ROM      Transfers   Transfers  Sit to Stand;Stand to Sit    Sit to Stand  7: Independent    Five time sit to stand comments   9.22 seconds for 5 Time Sit to Stand Test     Stand to Sit  7: Independent      Ambulation/Gait   Ambulation/Gait  Yes    Ambulation/Gait Assistance  5: Supervision;4: Min guard;4: Min assist    Ambulation/Gait Assistance Details  S when ambulating in busy clinic environments, min guard - min A when dual tasking, performing portions of FGA (see details, below), and when turning (especially to left)     Ambulation Distance (Feet)  230 Feet   230 x 1, 115 x 3    Assistive device  None    Gait Pattern  Decreased arm swing - right;Decreased arm swing - left;Decreased step length - right;Decreased step length - left;Decreased dorsiflexion - right;Trendelenburg;Poor foot clearance - right    Ambulation Surface  Level;Indoor    Stairs  Yes    Stairs Assistance  4: Min guard;4: Min assist;3: Mod assist    Stairs Assistance Details (indicate cue type and reason)  reciprocal gait pattern when ascending, step to gait pattern when descending with at least unilateral railling required when descending    Stair Management Technique  One rail Right;Alternating pattern;Step to pattern;Forwards   see details, above    Number of Stairs  4   4 x 2 sets    Height of Stairs  6      Balance   Balance Assessed  Yes      High Level Balance   High Level Balance Comments  Single limb stance: left lower extremity = 2.15 seconds; right lower extremity = 1.84 seconds  Functional Gait  Assessment   Gait assessed   Yes    Gait Level Surface  Walks 20 ft in less than 7 sec but greater than 5.5 sec, uses assistive device, slower speed, mild gait deviations, or deviates 6-10  in outside of the 12 in walkway width.    Change in Gait Speed  Able to change speed, demonstrates mild gait deviations, deviates 6-10 in outside of the 12 in walkway width, or no gait deviations, unable to achieve a major change in velocity, or uses a change in velocity, or uses an assistive device.    Gait with Horizontal Head Turns  Performs head turns with moderate changes in gait velocity, slows down, deviates 10-15 in outside 12 in walkway width but recovers, can continue to walk.    Gait with Vertical Head Turns  Performs task with slight change in gait velocity (eg, minor disruption to smooth gait path), deviates 6 - 10 in outside 12 in walkway width or uses assistive device    Gait and Pivot Turn  Pivot turns safely in greater than 3 sec and stops with no loss of balance, or pivot turns safely within 3 sec and stops with mild imbalance, requires small steps to catch balance.    Step Over Obstacle  Is able to step over one shoe box (4.5 in total height) but must slow down and adjust steps to clear box safely. May require verbal cueing.    Gait with Narrow Base of Support  Ambulates less than 4 steps heel to toe or cannot perform without assistance.    Gait with Eyes Closed  Walks 20 ft, slow speed, abnormal gait pattern, evidence for imbalance, deviates 10-15 in outside 12 in walkway width. Requires more than 9 sec to ambulate 20 ft.    Ambulating Backwards  Walks 20 ft, slow speed, abnormal gait pattern, evidence for imbalance, deviates 10-15 in outside 12 in walkway width.    Steps  Alternating feet, must use rail.    Total Score  14    FGA comment:  <19/30 demonstrates high risk for falling                 Objective measurements completed on examination: See above findings.              PT Education - 05/23/19 1226    Education Details  PT POC, findings from today's initial evaluation including high risk for falls and standardized test results    Person(s) Educated   Patient    Methods  Explanation    Comprehension  Verbalized understanding;Need further instruction       PT Short Term Goals - 05/23/19 1242      PT SHORT TERM GOAL #1   Title  Patient will be independent with HEP to foster lower extremity strength and balance to decrease fall risk and improve strength. (ALL STGs due 06/22/19)    Time  4    Period  Weeks    Status  New    Target Date  06/22/19      PT SHORT TERM GOAL #2   Title  Patient will score >/= 19/30 on the FGA to indicate a decrease in risk for falls.    Time  4    Period  Weeks    Status  New      PT SHORT TERM GOAL #3   Title  Patient will demonstrate ability to ambulate >/= 500 feet including indoor and outdoor level surfaces  with supervision to indicate a decrease in fall risk with community ambulation.    Time  4    Period  Weeks    Status  New      PT SHORT TERM GOAL #4   Title  Patient will demonstrate ability to navigate 8 steps (4 steps, 2 sets) with supervision and unilateral railing to indicate a decrease in fall risk with stair negotiation.    Time  4    Period  Weeks    Status  New      PT SHORT TERM GOAL #5   Title  Patient will demonstrate ability to maintain single limb stance on each lower extremity for >/= 5 seconds safety to indicate improvement in balance and decreae in fall risk.    Time  4    Period  Weeks    Status  New        PT Long Term Goals - 05/23/19 1235      PT LONG TERM GOAL #1   Title  Patient will be independent with HEP to foster lower extremity strength and balance to decrease fall risk and improve strength. (ALL LTGs DUE 07/22/19)    Time  8    Period  Weeks    Status  New    Target Date  07/22/19      PT LONG TERM GOAL #2   Title  Patient will score >/= 24/30 on the FGA to demonstrate a decrease in fall risk.    Time  8    Period  Weeks    Status  New      PT LONG TERM GOAL #3   Title  Patient's gait velocity will be >/= 3.44 ft/sec with normalized gait pattern to  demonstrate increased safety when ambulating in the community.    Time  8    Period  Weeks    Status  New      PT LONG TERM GOAL #4   Title  Patient will demonstrate ability to ambulate >/= 1000 feet including indoor, outdoor, level, unlevel surfaces to demonstrate a decrease in risk of falls when ambulating for exercise/leisure with friends.    Time  8    Period  Weeks    Status  New      PT LONG TERM GOAL #5   Title  Patient will demonstrate ability to climb and descend 12 steps (full flight of stairs - 4 steps, 3 reps) with modified independence to demonstrate a decrease in fall risk.    Time  8    Period  Weeks    Status  New      Additional Long Term Goals   Additional Long Term Goals  Yes      PT LONG TERM GOAL #6   Title  Patient will demonstrate ability to maintain single limb stance on each lower extremity for >/= 10 seconds safety to indicate improvement in balance and decreae in fall risk.    Time  8    Period  Weeks    Status  New             Plan - 05/23/19 1230    Clinical Impression Statement  Patient is a 74 year old female referred to Neuro OPPT for evaluation with primary concern of gait instability and imbalance with a history significant for falls and many near falls.  Pt's PMH is significant for: hx significant for falls, heart murmur, s/p B TKAs (most recent in 2012), spinal  stenosis .The following deficits were present during the exam: abnormal gait mechanics, decreased and asymmetrical lower extremity strength, and impaired balance. Pt's FGA score of 14/30 indicates that she is at a high risk for falling. Of note, patient is historically a very active individual participating routinely in [employer] (forced recently to take a break due to [place]) and walks multiple times/week with friend for exercise. Pt would benefit from skilled PT to address these impairments and functional limitations to maximize functional mobility independence and  decrease fall risk.    Personal Factors and Comorbidities  Age;Comorbidity 1    Comorbidities  heart murmur, B TKAs, spinal stenosis    Examination-Activity Limitations  Bend;Lift;Squat;Locomotion Level;Stairs;Reach Overhead;Carry;Stand    Examination-Participation Restrictions  Cleaning;Laundry;Shop;Driving;Meal Prep;Community Activity    Stability/Clinical Decision Making  Evolving/Moderate complexity    Clinical Decision Making  Moderate    Rehab Potential  Excellent    PT Frequency  2x / week    PT Duration  8 weeks    PT Treatment/Interventions  ADLs/Self Care Home Management;Aquatic Therapy;Moist Heat;Electrical Stimulation;Cryotherapy;Ultrasound;DME Instruction;Gait training;Therapeutic exercise;Therapeutic activities;Functional mobility training;Stair training;Balance training;Neuromuscular re-education;Patient/family education;Manual techniques;Orthotic Fit/Training;Passive range of motion;Energy conservation    PT Next Visit Plan  initiate initial HEP fostering lower extremity strengthening and balance, high level balance    Consulted and Agree with Plan of Care  Patient       Patient will benefit from skilled therapeutic intervention in order to improve the following deficits and impairments:  Abnormal gait, Decreased balance, Decreased endurance, Decreased mobility, Difficulty walking, Decreased knowledge of precautions, Decreased range of motion, Impaired perceived functional ability, Improper body mechanics, Decreased activity tolerance, Decreased knowledge of use of DME, Decreased safety awareness, Decreased strength, Impaired flexibility, Postural dysfunction  Visit Diagnosis: Other abnormalities of gait and mobility  Muscle weakness (generalized)  Unsteadiness on feet  Difficulty in walking, not elsewhere classified  History of falling     Problem List Patient Active Problem List   Diagnosis Date Noted  . Asthma, chronic 04/18/2013  . HTN (hypertension) 04/18/2013     Jeff Davis, PT, DPT  05/23/2019, 1:05 PM  [facility] [address] [place], [place], [address] Phone: [phone]   Fax:  [phone]  Name: SHAWANDA FREILICH MRN: [id number] Date of Birth: 1945/12/15

## 2019-05-26 ENCOUNTER — Other Ambulatory Visit: Payer: Self-pay

## 2019-05-26 ENCOUNTER — Ambulatory Visit: Payer: Medicare PPO | Admitting: Physical Therapy

## 2019-05-26 DIAGNOSIS — R2681 Unsteadiness on feet: Secondary | ICD-10-CM

## 2019-05-26 DIAGNOSIS — M6281 Muscle weakness (generalized): Secondary | ICD-10-CM

## 2019-05-26 DIAGNOSIS — R2689 Other abnormalities of gait and mobility: Secondary | ICD-10-CM | POA: Diagnosis not present

## 2019-05-26 DIAGNOSIS — R262 Difficulty in walking, not elsewhere classified: Secondary | ICD-10-CM

## 2019-05-26 NOTE — Therapy (Signed)
[facility] [address] [place], [place], [address] Phone: [phone]   Fax:  [phone]  Physical Therapy Treatment  Patient Details  Name: [name] MRN: [id number] Date of Birth: 09-24-1945 Referring Provider (PT): [place]   Encounter Date: 05/26/2019  PT End of Session - 05/26/19 1617    Visit Number  2    Number of Visits  17    Date for PT Re-Evaluation  07/22/19    Authorization Type  [employer]   10th visit progress note   PT Start Time  1234    PT Stop Time  1314    PT Time Calculation (min)  40 min    Equipment Utilized During Treatment  Gait belt    Activity Tolerance  Patient tolerated treatment well    Behavior During Therapy  [facility] for tasks assessed/performed       Past Medical History:  Diagnosis Date  . Allergy    Hay fever/bee stings  . Anxiety   . Arthritis   . Asthma   . Blood transfusion without reported diagnosis 2012  . Cataract   . Depression   . GERD (gastroesophageal reflux disease)   . Heart murmur   . Hypertension     Past Surgical History:  Procedure Laterality Date  . ABDOMINAL HYSTERECTOMY  1986  . MELANOMA EXCISION  2009   left shoulder near neck  . REPLACEMENT TOTAL KNEE BILATERAL  05/2010 &05/2010    There were no vitals filed for this visit.  Subjective Assessment - 05/26/19 1237    Subjective  Reports that she feels like her hands have gotten weaker - potentially interested in the future for an OT referral. 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 Had a couple of almost falls over the weekend - was changing direction.    Pertinent History  hx significant for falls, heart murmur, s/p B TKAs (most recent in 2012), spinal stenosis    Limitations  Standing;Walking;Lifting;House hold activities    How long can you walk comfortably?  2-3 miles - I walk this with a friend 2x/wk    Patient Stated Goals  I want to get my balance back; I want to be  steadier on my feet and not worry about falling down    Currently in Pain?  No/denies                       [facility] Adult PT Treatment/Exercise - 05/26/19 0001      Neuro Re-ed    Neuro Re-ed Details   In // bars: alternating SLS to cones x8 reps B with UE support/fingertip support on bars, x6 reps B with no UE support, cues to perform more slowly and gently tap toe to cone. On blue foam balance beam with wide BOS: 2 x 10 reps head turns R and L - a couple episodes of min A for balance       Access Code: [email or web address] URL: [email or web address] Date: 05/26/2019 Prepared by: Janann August  Initial HEP, first couple to be performed at countertop with cues to perform with fingertip support/floating hand away from counter for balance :   Exercises Standing Marching - 1 x daily - 4 x weekly - 3 sets - cues to perform slowly at countertop  Heel Walking - 1 x daily - 4 x weekly - 3 sets Backward Walking with Counter Support - 4 x weekly - 3 sets Tandem Walking  with Counter Support - 1 x daily - 4 x weekly - 3 sets Standing Balance with Eyes Closed on Foam - 1 x daily - 4 x weekly - 3 sets - 20 hold - on single more thin pillow with feet apart  Romberg Stance on Foam Pad with Head Rotation - 1 x daily - 4 x weekly - 2 sets - 10 reps - eyes open on pillow         PT Education - 05/26/19 1617    Education Details  initial HEP    Person(s) Educated  Patient    Methods  Explanation;Demonstration;Handout    Comprehension  Verbalized understanding;Returned demonstration       PT Short Term Goals - 05/23/19 1242      PT SHORT TERM GOAL #1   Title  Patient will be independent with HEP to foster lower extremity strength and balance to decrease fall risk and improve strength. (ALL STGs due 06/22/19)    Time  4    Period  Weeks    Status  New    Target Date  06/22/19      PT SHORT TERM GOAL #2   Title  Patient will score >/= 19/30 on the FGA to indicate a decrease  in risk for falls.    Time  4    Period  Weeks    Status  New      PT SHORT TERM GOAL #3   Title  Patient will demonstrate ability to ambulate >/= 500 feet including indoor and outdoor level surfaces with supervision to indicate a decrease in fall risk with community ambulation.    Time  4    Period  Weeks    Status  New      PT SHORT TERM GOAL #4   Title  Patient will demonstrate ability to navigate 8 steps (4 steps, 2 sets) with supervision and unilateral railing to indicate a decrease in fall risk with stair negotiation.    Time  4    Period  Weeks    Status  New      PT SHORT TERM GOAL #5   Title  Patient will demonstrate ability to maintain single limb stance on each lower extremity for >/= 5 seconds safety to indicate improvement in balance and decreae in fall risk.    Time  4    Period  Weeks    Status  New        PT Long Term Goals - 05/23/19 1235      PT LONG TERM GOAL #1   Title  Patient will be independent with HEP to foster lower extremity strength and balance to decrease fall risk and improve strength. (ALL LTGs DUE 07/22/19)    Time  8    Period  Weeks    Status  New    Target Date  07/22/19      PT LONG TERM GOAL #2   Title  Patient will score >/= 24/30 on the FGA to demonstrate a decrease in fall risk.    Time  8    Period  Weeks    Status  New      PT LONG TERM GOAL #3   Title  Patient's gait velocity will be >/= 3.44 ft/sec with normalized gait pattern to demonstrate increased safety when ambulating in the community.    Time  8    Period  Weeks    Status  New      PT  LONG TERM GOAL #4   Title  Patient will demonstrate ability to ambulate >/= 1000 feet including indoor, outdoor, level, unlevel surfaces to demonstrate a decrease in risk of falls when ambulating for exercise/leisure with friends.    Time  8    Period  Weeks    Status  New      PT LONG TERM GOAL #5   Title  Patient will demonstrate ability to climb and descend 12 steps (full flight  of stairs - 4 steps, 3 reps) with modified independence to demonstrate a decrease in fall risk.    Time  8    Period  Weeks    Status  New      Additional Long Term Goals   Additional Long Term Goals  Yes      PT LONG TERM GOAL #6   Title  Patient will demonstrate ability to maintain single limb stance on each lower extremity for >/= 10 seconds safety to indicate improvement in balance and decreae in fall risk.    Time  8    Period  Weeks    Status  New            Plan - 05/26/19 1618    Clinical Impression Statement  Focus of today's skilled session was initiating HEP - focused on standing dynamic balance and corner balance on a compliant suface. Pt with increased difficulty with eyes closed with wide BOS on a single pillow - indicating decreased vestibular input for balance. Also challenged by standing on foam with head motions. Will continue to progress towards LTGs.    Personal Factors and Comorbidities  Age;Comorbidity 1    Comorbidities  heart murmur, B TKAs, spinal stenosis    Examination-Activity Limitations  Bend;Lift;Squat;Locomotion Level;Stairs;Reach Overhead;Carry;Stand    Examination-Participation Restrictions  Cleaning;Laundry;Shop;Driving;Meal Prep;Community Activity    Stability/Clinical Decision Making  Evolving/Moderate complexity    Rehab Potential  Excellent    PT Frequency  2x / week    PT Duration  8 weeks    PT Treatment/Interventions  ADLs/Self Care Home Management;Aquatic Therapy;Moist Heat;Electrical Stimulation;Cryotherapy;Ultrasound;DME Instruction;Gait training;Therapeutic exercise;Therapeutic activities;Functional mobility training;Stair training;Balance training;Neuromuscular re-education;Patient/family education;Manual techniques;Orthotic Fit/Training;Passive range of motion;Energy conservation    PT Next Visit Plan  how was HEP? - SLS, eyes closed balance on compliant surfaces, tandem gait, high level balance    Consulted and Agree with Plan of Care   Patient       Patient will benefit from skilled therapeutic intervention in order to improve the following deficits and impairments:  Abnormal gait, Decreased balance, Decreased endurance, Decreased mobility, Difficulty walking, Decreased knowledge of precautions, Decreased range of motion, Impaired perceived functional ability, Improper body mechanics, Decreased activity tolerance, Decreased knowledge of use of DME, Decreased safety awareness, Decreased strength, Impaired flexibility, Postural dysfunction  Visit Diagnosis: Muscle weakness (generalized)  Other abnormalities of gait and mobility  Unsteadiness on feet  Difficulty in walking, not elsewhere classified     Problem List Patient Active Problem List   Diagnosis Date Noted  . Asthma, chronic 04/18/2013  . HTN (hypertension) 04/18/2013    Arliss Journey, PT, DPT  05/26/2019, 4:22 PM  [facility] [address] [place], [place], [address] Phone: [phone]   Fax:  [phone]  Name: MICEALA LETTIERE MRN: [id number] Date of Birth: 29-Aug-1945

## 2019-05-26 NOTE — Patient Instructions (Signed)
Access Code: [email or web address] URL: [email or web address] Date: 05/26/2019 Prepared by: Janann August  Exercises Standing Marching - 1 x daily - 4 x weekly - 3 sets Heel Walking - 1 x daily - 4 x weekly - 3 sets Backward Walking with Counter Support - 4 x weekly - 3 sets Tandem Walking with Counter Support - 1 x daily - 4 x weekly - 3 sets Standing Balance with Eyes Closed on Foam - 1 x daily - 4 x weekly - 3 sets - 20 hold Romberg Stance on Foam Pad with Head Rotation - 1 x daily - 4 x weekly - 2 sets - 10 reps

## 2019-05-30 ENCOUNTER — Encounter: Payer: Self-pay | Admitting: Rehabilitative and Restorative Service Providers"

## 2019-05-30 ENCOUNTER — Other Ambulatory Visit: Payer: Self-pay

## 2019-05-30 ENCOUNTER — Ambulatory Visit: Payer: Medicare PPO | Admitting: Rehabilitative and Restorative Service Providers"

## 2019-05-30 DIAGNOSIS — Z9181 History of falling: Secondary | ICD-10-CM

## 2019-05-30 DIAGNOSIS — R2689 Other abnormalities of gait and mobility: Secondary | ICD-10-CM

## 2019-05-30 DIAGNOSIS — R262 Difficulty in walking, not elsewhere classified: Secondary | ICD-10-CM

## 2019-05-30 DIAGNOSIS — M6281 Muscle weakness (generalized): Secondary | ICD-10-CM

## 2019-05-30 DIAGNOSIS — R2681 Unsteadiness on feet: Secondary | ICD-10-CM

## 2019-05-30 NOTE — Therapy (Signed)
[facility] [address] [place], [place], [address] Phone: [phone]   Fax:  [phone]  Physical Therapy Treatment  Patient Details  Name: [name] MRN: [id number] Date of Birth: Jun 24, 1945 Referring Provider (PT): [place]   Encounter Date: 05/30/2019  PT End of Session - 05/30/19 1312    Visit Number  3    Number of Visits  17    Date for PT Re-Evaluation  07/22/19    Authorization Type  [employer]   10th visit progress note   PT Start Time  1230    PT Stop Time  1313    PT Time Calculation (min)  43 min    Equipment Utilized During Treatment  Gait belt    Activity Tolerance  Patient tolerated treatment well    Behavior During Therapy  [facility] for tasks assessed/performed       Past Medical History:  Diagnosis Date  . Allergy    Hay fever/bee stings  . Anxiety   . Arthritis   . Asthma   . Blood transfusion without reported diagnosis 2012  . Cataract   . Depression   . GERD (gastroesophageal reflux disease)   . Heart murmur   . Hypertension     Past Surgical History:  Procedure Laterality Date  . ABDOMINAL HYSTERECTOMY  1986  . MELANOMA EXCISION  2009   left shoulder near neck  . REPLACEMENT TOTAL KNEE BILATERAL  05/2010 &05/2010    There were no vitals filed for this visit.  Subjective Assessment - 05/30/19 1229    Subjective  Patient reports she is working on finding a foam surface for her HEP issued yesterday. Compliant with HEP daily. No falls, but several near falls when turning.    Pertinent History  hx significant for falls, heart murmur, s/p B TKAs (most recent in 2012), spinal stenosis    Limitations  Standing;Walking;Lifting;House hold activities    How long can you walk comfortably?  2-3 miles - I walk this with a friend 2x/wk    Patient Stated Goals  I want to get my balance back; I want to be steadier on my feet and not worry about falling down    Currently in  Pain?  No/denies                       [facility] Adult PT Treatment/Exercise - 05/30/19 1233      Ambulation/Gait   Ambulation/Gait  Yes    Ambulation/Gait Assistance  5: Supervision;4: Min guard;4: Min assist    Ambulation/Gait Assistance Details  Outdoors and indoors on level and unlevel surfaces (paved inclines and grass) with head turns/nods and dual tasking (counting windows, trees, talking). Close S required for all with min A required with L head turn and vertical head turn.     Ambulation Distance (Feet)  350 Feet   350 x 1, 115 x 4, multiple short distances in gym   Assistive device  None    Gait Pattern  Decreased arm swing - right;Decreased arm swing - left;Decreased step length - right;Decreased step length - left;Decreased dorsiflexion - right;Trendelenburg;Poor foot clearance - right    Ambulation Surface  Level;Unlevel;Indoor;Outdoor;Paved;Grass      Neuro Re-ed    Neuro Re-ed Details   In parallel bars: fwd foot taps on cones x 10, 2 sets each with 1 finger bilateral support; lateral foot taps on cones + 1 finger bilateral support requiring min guard -  min A on second portion set. Facing counter on Airdex + lateral foot taps to targets x 12 each direction requiring min A with second portion of set due to fatigue and great increase in sway. At counter with blue mat: fwd/back walking with light unilateral UE support. 90 degree turns started at counter progressed to open environment but only able to perform a few repetitions due to fatigue. Requires multiple demos and cueing for proper technique and foot placement with up to mod A to prevent LOB (when fatigued)               PT Short Term Goals - 05/23/19 1242      PT SHORT TERM GOAL #1   Title  Patient will be independent with HEP to foster lower extremity strength and balance to decrease fall risk and improve strength. (ALL STGs due 06/22/19)    Time  4    Period  Weeks    Status  New    Target Date   06/22/19      PT SHORT TERM GOAL #2   Title  Patient will score >/= 19/30 on the FGA to indicate a decrease in risk for falls.    Time  4    Period  Weeks    Status  New      PT SHORT TERM GOAL #3   Title  Patient will demonstrate ability to ambulate >/= 500 feet including indoor and outdoor level surfaces with supervision to indicate a decrease in fall risk with community ambulation.    Time  4    Period  Weeks    Status  New      PT SHORT TERM GOAL #4   Title  Patient will demonstrate ability to navigate 8 steps (4 steps, 2 sets) with supervision and unilateral railing to indicate a decrease in fall risk with stair negotiation.    Time  4    Period  Weeks    Status  New      PT SHORT TERM GOAL #5   Title  Patient will demonstrate ability to maintain single limb stance on each lower extremity for >/= 5 seconds safety to indicate improvement in balance and decreae in fall risk.    Time  4    Period  Weeks    Status  New        PT Long Term Goals - 05/23/19 1235      PT LONG TERM GOAL #1   Title  Patient will be independent with HEP to foster lower extremity strength and balance to decrease fall risk and improve strength. (ALL LTGs DUE 07/22/19)    Time  8    Period  Weeks    Status  New    Target Date  07/22/19      PT LONG TERM GOAL #2   Title  Patient will score >/= 24/30 on the FGA to demonstrate a decrease in fall risk.    Time  8    Period  Weeks    Status  New      PT LONG TERM GOAL #3   Title  Patient's gait velocity will be >/= 3.44 ft/sec with normalized gait pattern to demonstrate increased safety when ambulating in the community.    Time  8    Period  Weeks    Status  New      PT LONG TERM GOAL #4   Title  Patient will demonstrate ability to ambulate >/= 1000  feet including indoor, outdoor, level, unlevel surfaces to demonstrate a decrease in risk of falls when ambulating for exercise/leisure with friends.    Time  8    Period  Weeks    Status  New       PT LONG TERM GOAL #5   Title  Patient will demonstrate ability to climb and descend 12 steps (full flight of stairs - 4 steps, 3 reps) with modified independence to demonstrate a decrease in fall risk.    Time  8    Period  Weeks    Status  New      Additional Long Term Goals   Additional Long Term Goals  Yes      PT LONG TERM GOAL #6   Title  Patient will demonstrate ability to maintain single limb stance on each lower extremity for >/= 10 seconds safety to indicate improvement in balance and decreae in fall risk.    Time  8    Period  Weeks    Status  New            Plan - 05/30/19 1425    Clinical Impression Statement  Today's skilled PT session focused on progression of patient's higher level balance and gait challenges, which she tolerated well. Demonstrates great increase in sway and near LOB with vertical and left head turns indicating a heavy reliance on vision/visual fixation to safely/consistently maintain balance. She will benefit from continued skilled PT in order to maximize her functional mobility and decrease fall risk.    Personal Factors and Comorbidities  Age;Comorbidity 1    Comorbidities  heart murmur, B TKAs, spinal stenosis    Examination-Activity Limitations  Bend;Lift;Squat;Locomotion Level;Stairs;Reach Overhead;Carry;Stand    Examination-Participation Restrictions  Cleaning;Laundry;Shop;Driving;Meal Prep;Community Activity    Stability/Clinical Decision Making  Evolving/Moderate complexity    Rehab Potential  Excellent    PT Frequency  2x / week    PT Duration  8 weeks    PT Treatment/Interventions  ADLs/Self Care Home Management;Aquatic Therapy;Moist Heat;Electrical Stimulation;Cryotherapy;Ultrasound;DME Instruction;Gait training;Therapeutic exercise;Therapeutic activities;Functional mobility training;Stair training;Balance training;Neuromuscular re-education;Patient/family education;Manual techniques;Orthotic Fit/Training;Passive range of motion;Energy  conservation    PT Next Visit Plan  single limb stance, challenge vestibular system reliance for balance maintenance, tandem gait, high level balance    Consulted and Agree with Plan of Care  Patient       Patient will benefit from skilled therapeutic intervention in order to improve the following deficits and impairments:  Abnormal gait, Decreased balance, Decreased endurance, Decreased mobility, Difficulty walking, Decreased knowledge of precautions, Decreased range of motion, Impaired perceived functional ability, Improper body mechanics, Decreased activity tolerance, Decreased knowledge of use of DME, Decreased safety awareness, Decreased strength, Impaired flexibility, Postural dysfunction  Visit Diagnosis: Muscle weakness (generalized)  Other abnormalities of gait and mobility  Unsteadiness on feet  Difficulty in walking, not elsewhere classified  History of falling     Problem List Patient Active Problem List   Diagnosis Date Noted  . Asthma, chronic 04/18/2013  . HTN (hypertension) 04/18/2013    Pe Ell, PT, DPT  05/30/2019, 2:30 PM  [facility] [address] [place], [place], [address] Phone: [phone]   Fax:  [phone]  Name: LANY DEVAUL MRN: [id number] Date of Birth: 12/21/1945

## 2019-06-02 ENCOUNTER — Ambulatory Visit: Payer: Medicare PPO | Attending: Internal Medicine | Admitting: Physical Therapy

## 2019-06-02 ENCOUNTER — Other Ambulatory Visit: Payer: Self-pay

## 2019-06-02 DIAGNOSIS — R2681 Unsteadiness on feet: Secondary | ICD-10-CM

## 2019-06-02 DIAGNOSIS — R2689 Other abnormalities of gait and mobility: Secondary | ICD-10-CM | POA: Diagnosis present

## 2019-06-02 DIAGNOSIS — R262 Difficulty in walking, not elsewhere classified: Secondary | ICD-10-CM

## 2019-06-02 DIAGNOSIS — M6281 Muscle weakness (generalized): Secondary | ICD-10-CM

## 2019-06-02 DIAGNOSIS — Z9181 History of falling: Secondary | ICD-10-CM | POA: Diagnosis present

## 2019-06-02 NOTE — Therapy (Signed)
[facility] [address] [place], [place], [address] Phone: [phone]   Fax:  [phone]  Physical Therapy Treatment  Patient Details  Name: [name] MRN: [id number] Date of Birth: [birth year] Referring Provider (PT): [place]   Encounter Date: 06/02/2019  PT End of Session - 06/02/19 0853    Visit Number  4    Number of Visits  17    Date for PT Re-Evaluation  07/22/19    Authorization Type  [employer]   10th visit progress note   PT Start Time  0806   pt arrived late   PT Stop Time  0844    PT Time Calculation (min)  38 min    Equipment Utilized During Treatment  Gait belt    Activity Tolerance  Patient tolerated treatment well    Behavior During Therapy  [facility] for tasks assessed/performed       Past Medical History:  Diagnosis Date  . Allergy    Hay fever/bee stings  . Anxiety   . Arthritis   . Asthma   . Blood transfusion without reported diagnosis 2012  . Cataract   . Depression   . GERD (gastroesophageal reflux disease)   . Heart murmur   . Hypertension     Past Surgical History:  Procedure Laterality Date  . ABDOMINAL HYSTERECTOMY  1986  . MELANOMA EXCISION  2009   left shoulder near neck  . REPLACEMENT TOTAL KNEE BILATERAL  05/2010 &05/2010    There were no vitals filed for this visit.  Subjective Assessment - 06/02/19 0808    Subjective  No falls, had a near miss or 2 when she was changing directions while walking. HEP is going well at home.    Pertinent History  hx significant for falls, heart murmur, s/p B TKAs (most recent in 2012), spinal stenosis    Limitations  Standing;Walking;Lifting;House hold activities    How long can you walk comfortably?  2-3 miles - I walk this with a friend 2x/wk    Patient Stated Goals  I want to get my balance back; I want to be steadier on my feet and not worry about falling down    Currently in Pain?  No/denies                        [facility] Adult PT Treatment/Exercise - 06/02/19 0001      Ambulation/Gait   Ambulation/Gait  Yes    Ambulation/Gait Assistance  4: Min guard;5: Supervision    Ambulation/Gait Assistance Details  around gym - performing head turns/head nods, incr gait speed and then ambulating slowly, sudden stops, and asking pt to change direction, pt needing min guard with sudden stops (esp initially) and when performing head nods for balance    Ambulation Distance (Feet)  400 Feet    Assistive device  None    Gait Pattern  Decreased arm swing - right;Decreased arm swing - left;Decreased step length - right;Decreased step length - left;Decreased dorsiflexion - right;Trendelenburg;Poor foot clearance - right    Ambulation Surface  Level;Indoor          Balance Exercises - 06/02/19 0842      Balance Exercises: Standing   Standing Eyes Closed  Wide (BOA);Foam/compliant surface;5 reps;20 secs;30 secs;Limitations    Standing Eyes Closed Limitations  beginning with wider BOS and then progressing to feet hip width distance     Stepping Strategy  Posterior;Anterior;Foam/compliant surface;Limitations  Stepping Strategy Limitations  x10 reps each direction alternating, cues for technique and weight shift, needing min guard/min A esp with posterior stepping strategy    Other Standing Exercises  On rockerboard in A/P direction: 20 reps weight shifting onto toes and then back onto heels - beginning with BUE support and progressing to none with cues to perform slowly, intermittent min A for balance, progressing to maintaining board still for 30 seconds, while trying to stay steady on board 2 x 5 reps head turns R/L. On rockerboard in M/L direction: weight shifting to R and L with no UE support x12 reps, progressing to holding still for 30 seconds, and then 2 x 5 reps head nods with min guard for balance.     Other Standing Exercises Comments  In corner on blue foam: alternating toe taps  to 2 colorful floor bubbles x10 reps with no UE support, alternating heel taps to colorful floor bubbles x10 reps starting with single UE support and no UE support - cues for technique with min guard for balance.           PT Short Term Goals - 05/23/19 1242      PT SHORT TERM GOAL #1   Title  Patient will be independent with HEP to foster lower extremity strength and balance to decrease fall risk and improve strength. (ALL STGs due 06/22/19)    Time  4    Period  Weeks    Status  New    Target Date  06/22/19      PT SHORT TERM GOAL #2   Title  Patient will score >/= 19/30 on the FGA to indicate a decrease in risk for falls.    Time  4    Period  Weeks    Status  New      PT SHORT TERM GOAL #3   Title  Patient will demonstrate ability to ambulate >/= 500 feet including indoor and outdoor level surfaces with supervision to indicate a decrease in fall risk with community ambulation.    Time  4    Period  Weeks    Status  New      PT SHORT TERM GOAL #4   Title  Patient will demonstrate ability to navigate 8 steps (4 steps, 2 sets) with supervision and unilateral railing to indicate a decrease in fall risk with stair negotiation.    Time  4    Period  Weeks    Status  New      PT SHORT TERM GOAL #5   Title  Patient will demonstrate ability to maintain single limb stance on each lower extremity for >/= 5 seconds safety to indicate improvement in balance and decreae in fall risk.    Time  4    Period  Weeks    Status  New        PT Long Term Goals - 05/23/19 1235      PT LONG TERM GOAL #1   Title  Patient will be independent with HEP to foster lower extremity strength and balance to decrease fall risk and improve strength. (ALL LTGs DUE 07/22/19)    Time  8    Period  Weeks    Status  New    Target Date  07/22/19      PT LONG TERM GOAL #2   Title  Patient will score >/= 24/30 on the FGA to demonstrate a decrease in fall risk.    Time  8  Period  Weeks    Status  New       PT LONG TERM GOAL #3   Title  Patient's gait velocity will be >/= 3.44 ft/sec with normalized gait pattern to demonstrate increased safety when ambulating in the community.    Time  8    Period  Weeks    Status  New      PT LONG TERM GOAL #4   Title  Patient will demonstrate ability to ambulate >/= 1000 feet including indoor, outdoor, level, unlevel surfaces to demonstrate a decrease in risk of falls when ambulating for exercise/leisure with friends.    Time  8    Period  Weeks    Status  New      PT LONG TERM GOAL #5   Title  Patient will demonstrate ability to climb and descend 12 steps (full flight of stairs - 4 steps, 3 reps) with modified independence to demonstrate a decrease in fall risk.    Time  8    Period  Weeks    Status  New      Additional Long Term Goals   Additional Long Term Goals  Yes      PT LONG TERM GOAL #6   Title  Patient will demonstrate ability to maintain single limb stance on each lower extremity for >/= 10 seconds safety to indicate improvement in balance and decreae in fall risk.    Time  8    Period  Weeks    Status  New            Plan - 06/02/19 0856    Clinical Impression Statement  Focus of today's skilled session was dynamic gait training and balance on compliant surfaces. Pt needing min guard during gait for balance especially with sudden stops and with head nods. Pt improved with increasing reps especially with SLS and anterior/posterior stepping strategies on foam. Will continue to progress towards LTGs.    Personal Factors and Comorbidities  Age;Comorbidity 1    Comorbidities  heart murmur, B TKAs, spinal stenosis    Examination-Activity Limitations  Bend;Lift;Squat;Locomotion Level;Stairs;Reach Overhead;Carry;Stand    Examination-Participation Restrictions  Cleaning;Laundry;Shop;Driving;Meal Prep;Community Activity    Stability/Clinical Decision Making  Evolving/Moderate complexity    Rehab Potential  Excellent    PT Frequency   2x / week    PT Duration  8 weeks    PT Treatment/Interventions  ADLs/Self Care Home Management;Aquatic Therapy;Moist Heat;Electrical Stimulation;Cryotherapy;Ultrasound;DME Instruction;Gait training;Therapeutic exercise;Therapeutic activities;Functional mobility training;Stair training;Balance training;Neuromuscular re-education;Patient/family education;Manual techniques;Orthotic Fit/Training;Passive range of motion;Energy conservation    PT Next Visit Plan  single limb stance, challenge vestibular system reliance for balance maintenance, tandem gait, high level balance, gait with changes in direction/sudden starts and stops    Consulted and Agree with Plan of Care  Patient       Patient will benefit from skilled therapeutic intervention in order to improve the following deficits and impairments:  Abnormal gait, Decreased balance, Decreased endurance, Decreased mobility, Difficulty walking, Decreased knowledge of precautions, Decreased range of motion, Impaired perceived functional ability, Improper body mechanics, Decreased activity tolerance, Decreased knowledge of use of DME, Decreased safety awareness, Decreased strength, Impaired flexibility, Postural dysfunction  Visit Diagnosis: Muscle weakness (generalized)  Other abnormalities of gait and mobility  Unsteadiness on feet  Difficulty in walking, not elsewhere classified     Problem List Patient Active Problem List   Diagnosis Date Noted  . Asthma, chronic 04/18/2013  . HTN (hypertension) 04/18/2013    Arliss Journey,  PT, DPT  06/02/2019, 8:57 AM  [facility] [address] [place], [place], [address] Phone: [phone]   Fax:  [phone]  Name: [name] MRN: [id number] Date of Birth: 24-Jan-1946

## 2019-06-06 ENCOUNTER — Ambulatory Visit: Payer: Medicare PPO | Admitting: Rehabilitative and Restorative Service Providers"

## 2019-06-06 ENCOUNTER — Other Ambulatory Visit: Payer: Self-pay

## 2019-06-06 ENCOUNTER — Encounter: Payer: Self-pay | Admitting: Rehabilitative and Restorative Service Providers"

## 2019-06-06 DIAGNOSIS — M6281 Muscle weakness (generalized): Secondary | ICD-10-CM

## 2019-06-06 DIAGNOSIS — R2681 Unsteadiness on feet: Secondary | ICD-10-CM

## 2019-06-06 DIAGNOSIS — R2689 Other abnormalities of gait and mobility: Secondary | ICD-10-CM

## 2019-06-06 DIAGNOSIS — Z9181 History of falling: Secondary | ICD-10-CM

## 2019-06-06 DIAGNOSIS — R262 Difficulty in walking, not elsewhere classified: Secondary | ICD-10-CM

## 2019-06-06 NOTE — Therapy (Signed)
[facility] [address] [place], [place], [address] Phone: [phone]   Fax:  [phone]  Physical Therapy Treatment  Patient Details  Name: [name] MRN: [id number] Date of Birth: Aug 06, 1945 Referring Provider (PT): Clatonia Utah   Encounter Date: 06/06/2019  PT End of Session - 06/06/19 1157    Visit Number  5    Number of Visits  17    Date for PT Re-Evaluation  07/22/19    Authorization Type  [employer]   10th visit progress note   PT Start Time  1101    PT Stop Time  1142    PT Time Calculation (min)  41 min    Equipment Utilized During Treatment  Gait belt    Activity Tolerance  Patient tolerated treatment well    Behavior During Therapy  [place] for tasks assessed/performed       Past Medical History:  Diagnosis Date  . Allergy    Hay fever/bee stings  . Anxiety   . Arthritis   . Asthma   . Blood transfusion without reported diagnosis 2012  . Cataract   . Depression   . GERD (gastroesophageal reflux disease)   . Heart murmur   . Hypertension     Past Surgical History:  Procedure Laterality Date  . ABDOMINAL HYSTERECTOMY  1986  . MELANOMA EXCISION  2009   left shoulder near neck  . REPLACEMENT TOTAL KNEE BILATERAL  05/2010 &05/2010    There were no vitals filed for this visit.  Subjective Assessment - 06/06/19 1102    Subjective  Denies any falls. Reports 1 near fall over an elevated surface when taking a step backwards when walking with a friend.    Pertinent History  hx significant for falls, heart murmur, s/p B TKAs (most recent in 2012), spinal stenosis    Limitations  Standing;Walking;Lifting;House hold activities    How long can you walk comfortably?  2-3 miles - I walk this with a friend 2x/wk    Patient Stated Goals  I want to get my balance back; I want to be steadier on my feet and not worry about falling down    Currently in Pain?  No/denies                        [facility] Adult PT Treatment/Exercise - 06/06/19 1105      Ambulation/Gait   Ambulation/Gait  Yes    Ambulation/Gait Assistance  4: Min assist;4: Min guard;5: Supervision    Ambulation/Gait Assistance Details  Indoor and outdoor ambulation wiht head turns/nods, obstacle negotiation (around obstacles), 180 degree turns/stops on compliant (grass) and solid surfaces. Requires S when ambulating in quiet, solid surface, level environments. Requires min guard - min A on compliant surfaces, with head turns/nods, dual tasking.    Ambulation Distance (Feet)  500 Feet   500 x 1, 150 x 2 (standing rest break between)   Assistive device  None    Gait Pattern  Decreased arm swing - right;Decreased arm swing - left;Decreased step length - right;Decreased step length - left;Decreased dorsiflexion - right;Trendelenburg;Poor foot clearance - right    Ambulation Surface  Level;Unlevel;Indoor;Outdoor;Paved;Grass      Neuro Re-ed    Neuro Re-ed Details   Corner balance: on compliant surface + EO + FT + head turns/nods/diagonals x 30 seconds, 2 of each requiring "stop" midway through head motion to fix eyes (unable to turn full ROM, must use fixation  midway through to maintain balance). On complaint surface + feet together + eyes closed x 30 seconds, 3 reps (started with feet apart, 15 seconds, 2 reps but able to progress). Intermittent use of wall bumps/taps utilized especially with EC, but demo qualitative improvement throughout session               PT Education - 06/06/19 1156    Education Details  update to HEP (addition of corner balance) - see patient instructions    Person(s) Educated  Patient    Methods  Explanation;Demonstration;Tactile cues;Verbal cues;Handout    Comprehension  Verbalized understanding;Returned demonstration;Need further instruction       PT Short Term Goals - 05/23/19 1242      PT SHORT TERM GOAL #1   Title  Patient will be independent with  HEP to foster lower extremity strength and balance to decrease fall risk and improve strength. (ALL STGs due 06/22/19)    Time  4    Period  Weeks    Status  New    Target Date  06/22/19      PT SHORT TERM GOAL #2   Title  Patient will score >/= 19/30 on the FGA to indicate a decrease in risk for falls.    Time  4    Period  Weeks    Status  New      PT SHORT TERM GOAL #3   Title  Patient will demonstrate ability to ambulate >/= 500 feet including indoor and outdoor level surfaces with supervision to indicate a decrease in fall risk with community ambulation.    Time  4    Period  Weeks    Status  New      PT SHORT TERM GOAL #4   Title  Patient will demonstrate ability to navigate 8 steps (4 steps, 2 sets) with supervision and unilateral railing to indicate a decrease in fall risk with stair negotiation.    Time  4    Period  Weeks    Status  New      PT SHORT TERM GOAL #5   Title  Patient will demonstrate ability to maintain single limb stance on each lower extremity for >/= 5 seconds safety to indicate improvement in balance and decreae in fall risk.    Time  4    Period  Weeks    Status  New        PT Long Term Goals - 05/23/19 1235      PT LONG TERM GOAL #1   Title  Patient will be independent with HEP to foster lower extremity strength and balance to decrease fall risk and improve strength. (ALL LTGs DUE 07/22/19)    Time  8    Period  Weeks    Status  New    Target Date  07/22/19      PT LONG TERM GOAL #2   Title  Patient will score >/= 24/30 on the FGA to demonstrate a decrease in fall risk.    Time  8    Period  Weeks    Status  New      PT LONG TERM GOAL #3   Title  Patient's gait velocity will be >/= 3.44 ft/sec with normalized gait pattern to demonstrate increased safety when ambulating in the community.    Time  8    Period  Weeks    Status  New      PT LONG TERM GOAL #4   Title  Patient will demonstrate ability to ambulate >/= 1000 feet including  indoor, outdoor, level, unlevel surfaces to demonstrate a decrease in risk of falls when ambulating for exercise/leisure with friends.    Time  8    Period  Weeks    Status  New      PT LONG TERM GOAL #5   Title  Patient will demonstrate ability to climb and descend 12 steps (full flight of stairs - 4 steps, 3 reps) with modified independence to demonstrate a decrease in fall risk.    Time  8    Period  Weeks    Status  New      Additional Long Term Goals   Additional Long Term Goals  Yes      PT LONG TERM GOAL #6   Title  Patient will demonstrate ability to maintain single limb stance on each lower extremity for >/= 10 seconds safety to indicate improvement in balance and decreae in fall risk.    Time  8    Period  Weeks    Status  New            Plan - 06/06/19 1157    Clinical Impression Statement  Today's skilled session focused on progression of corner balance exercises fostering improvement in utilization of vestibular system in maintenance of her balance as her balance both during corner balance exercises and during functional mobility (as noted during gait training today) degrades the more dependent her body is on vestibular input to maintain her balance. PT made updates to HEP accordingly. Patient will benefit from continued PT to decresae fall risk and work towards [place].    Personal Factors and Comorbidities  Age;Comorbidity 1    Comorbidities  heart murmur, B TKAs, spinal stenosis    Examination-Activity Limitations  Bend;Lift;Squat;Locomotion Level;Stairs;Reach Overhead;Carry;Stand    Examination-Participation Restrictions  Cleaning;Laundry;Shop;Driving;Meal Prep;Community Activity    Stability/Clinical Decision Making  Evolving/Moderate complexity    Rehab Potential  Excellent    PT Frequency  2x / week    PT Duration  8 weeks    PT Treatment/Interventions  ADLs/Self Care Home Management;Aquatic Therapy;Moist Heat;Electrical Stimulation;Cryotherapy;Ultrasound;DME  Instruction;Gait training;Therapeutic exercise;Therapeutic activities;Functional mobility training;Stair training;Balance training;Neuromuscular re-education;Patient/family education;Manual techniques;Orthotic Fit/Training;Passive range of motion;Energy conservation    PT Next Visit Plan  how is update to HEP (corner balance) going??, single limb stance, challenge vestibular system reliance for balance maintenance, tandem gait, high level balance, gait with changes in direction/sudden starts and stops    Consulted and Agree with Plan of Care  Patient       Patient will benefit from skilled therapeutic intervention in order to improve the following deficits and impairments:  Abnormal gait, Decreased balance, Decreased endurance, Decreased mobility, Difficulty walking, Decreased knowledge of precautions, Decreased range of motion, Impaired perceived functional ability, Improper body mechanics, Decreased activity tolerance, Decreased knowledge of use of DME, Decreased safety awareness, Decreased strength, Impaired flexibility, Postural dysfunction  Visit Diagnosis: Muscle weakness (generalized)  Other abnormalities of gait and mobility  Unsteadiness on feet  Difficulty in walking, not elsewhere classified  History of falling     Problem List Patient Active Problem List   Diagnosis Date Noted  . Asthma, chronic 04/18/2013  . HTN (hypertension) 04/18/2013    [place], PT, DPT  06/06/2019, 12:01 PM  [facility] [address] [place], [place], [address] Phone: [phone]   Fax:  [phone]  Name: [name] MRN: [id number] Date of Birth: 05-09-1945

## 2019-06-06 NOTE — Patient Instructions (Signed)
Corner Balance   1. Stand in the corner of a room with the back of a chair placed immediately in front of you standing on foam.   2. Stand with your feet together.  3. Close your eyes closed.  4. Bump the wall with your hips and tap your hands to the wall/chair back as needed.  5. Hold for 30 seconds.  Perform 3 repetitions. Taking a standing rest break between each one.  Perform this exercise 1-2x/day.   Corner Balance  1. Stand in the corner of a room with the back of a chair placed immediately in front of you standing on the floor (NO FOAM).    2. Stand with your feet together.  3. Eyes open. 4. Turn your head to the left side, then look straight ahead, then to the right, then straight ahead, continue to repeat for 30 seconds.  5. Then do this exercise but looking up and down.  6. Bump the wall with your hips and tap your hands to the wall/chair back as needed.  Perform 2 repetitions of each head turning/nodding direction. Taking a standing rest break between each one.  Perform this exercise 1-2 x/day.

## 2019-06-09 ENCOUNTER — Other Ambulatory Visit: Payer: Self-pay

## 2019-06-09 ENCOUNTER — Ambulatory Visit: Payer: Medicare PPO | Admitting: Physical Therapy

## 2019-06-09 DIAGNOSIS — M6281 Muscle weakness (generalized): Secondary | ICD-10-CM | POA: Diagnosis not present

## 2019-06-09 DIAGNOSIS — R2689 Other abnormalities of gait and mobility: Secondary | ICD-10-CM

## 2019-06-09 DIAGNOSIS — R262 Difficulty in walking, not elsewhere classified: Secondary | ICD-10-CM

## 2019-06-09 DIAGNOSIS — R2681 Unsteadiness on feet: Secondary | ICD-10-CM

## 2019-06-09 NOTE — Therapy (Signed)
[facility] [address] [place], [place], [address] Phone: [phone]   Fax:  [phone]  Physical Therapy Treatment  Patient Details  Name: [name] MRN: [id number] Date of Birth: Dec 31, 1945 Referring Provider (PT): Sargeant Utah   Encounter Date: 06/09/2019  PT End of Session - 06/09/19 1012    Visit Number  6    Number of Visits  17    Date for PT Re-Evaluation  07/22/19    Authorization Type  [employer]   10th visit progress note   PT Start Time  0932    PT Stop Time  1011    PT Time Calculation (min)  39 min    Equipment Utilized During Treatment  Gait belt    Activity Tolerance  Patient tolerated treatment well    Behavior During Therapy  [facility] for tasks assessed/performed       Past Medical History:  Diagnosis Date  . Allergy    Hay fever/bee stings  . Anxiety   . Arthritis   . Asthma   . Blood transfusion without reported diagnosis 2012  . Cataract   . Depression   . GERD (gastroesophageal reflux disease)   . Heart murmur   . Hypertension     Past Surgical History:  Procedure Laterality Date  . ABDOMINAL HYSTERECTOMY  1986  . MELANOMA EXCISION  2009   left shoulder near neck  . REPLACEMENT TOTAL KNEE BILATERAL  05/2010 &05/2010    There were no vitals filed for this visit.  Subjective Assessment - 06/09/19 0935    Subjective  Had a busy weekend. No falls. Had an almost fall - [place]. New exercises are going well.    Pertinent History  hx significant for falls, heart murmur, s/p B TKAs (most recent in 2012), spinal stenosis    Limitations  Standing;Walking;Lifting;House hold activities    How long can you walk comfortably?  2-3 miles - I walk this with a friend 2x/wk    Patient Stated Goals  I want to get my balance back; I want to be steadier on my feet and not worry about falling down    Currently in Pain?  No/denies                             Balance Exercises - 06/09/19 0951      Balance Exercises: Standing   SLS  Eyes open;Solid surface;Limitations    SLS Limitations  in // bars: alternating legs rolling soccer ball forward and back x5 reps for SLS - x3 each side with no UE support and then stepping foot off ball without holding on    Stepping Strategy  Posterior;Anterior;Foam/compliant surface;Limitations    Stepping Strategy Limitations  x10 reps each B on red foam beam, needing min guard for balance, pt able to utilize hip strategy and ocassionally holding onto bars to remain balanced esp with SLS on LLE     Rockerboard  Anterior/posterior;EO;Limitations    Rockerboard Limitations  in corner, trying to hold board still: 2 x 5 reps head turns, 2 x 5 reps head nods - min guard and pt using wall/chair anteriorly intermittently for balance, cues for hip strategy    Other Standing Exercises  multi-directional stepping following direction sheet (taped on cabinets) on red/blue mats - working in forward/backward/and lateral directions, pt needing intermittent min guard esp  with posterior stepping, adding cognitive challenges with pt being asked to perform the opposite of the direction written    Other Standing Exercises Comments  on blue compliant mat: alternating toe taps to soccer ball for SLS x8 reps B, progressing to alternating toe taps to cone for incr hip/knee flexion x10 reps B, progressing to alternating forward cone tap and then cross cone tap x7 reps B, with intermittent need for // bars for support, cues for slowed and controlled           PT Short Term Goals - 05/23/19 1242      PT SHORT TERM GOAL #1   Title  Patient will be independent with HEP to foster lower extremity strength and balance to decrease fall risk and improve strength. (ALL STGs due 06/22/19)    Time  4    Period  Weeks    Status  New    Target Date  06/22/19      PT SHORT TERM GOAL #2   Title  Patient  will score >/= 19/30 on the FGA to indicate a decrease in risk for falls.    Time  4    Period  Weeks    Status  New      PT SHORT TERM GOAL #3   Title  Patient will demonstrate ability to ambulate >/= 500 feet including indoor and outdoor level surfaces with supervision to indicate a decrease in fall risk with community ambulation.    Time  4    Period  Weeks    Status  New      PT SHORT TERM GOAL #4   Title  Patient will demonstrate ability to navigate 8 steps (4 steps, 2 sets) with supervision and unilateral railing to indicate a decrease in fall risk with stair negotiation.    Time  4    Period  Weeks    Status  New      PT SHORT TERM GOAL #5   Title  Patient will demonstrate ability to maintain single limb stance on each lower extremity for >/= 5 seconds safety to indicate improvement in balance and decreae in fall risk.    Time  4    Period  Weeks    Status  New        PT Long Term Goals - 05/23/19 1235      PT LONG TERM GOAL #1   Title  Patient will be independent with HEP to foster lower extremity strength and balance to decrease fall risk and improve strength. (ALL LTGs DUE 07/22/19)    Time  8    Period  Weeks    Status  New    Target Date  07/22/19      PT LONG TERM GOAL #2   Title  Patient will score >/= 24/30 on the FGA to demonstrate a decrease in fall risk.    Time  8    Period  Weeks    Status  New      PT LONG TERM GOAL #3   Title  Patient's gait velocity will be >/= 3.44 ft/sec with normalized gait pattern to demonstrate increased safety when ambulating in the community.    Time  8    Period  Weeks    Status  New      PT LONG TERM GOAL #4   Title  Patient will demonstrate ability to ambulate >/= 1000 feet including indoor, outdoor, level, unlevel surfaces to demonstrate a decrease in  risk of falls when ambulating for exercise/leisure with friends.    Time  8    Period  Weeks    Status  New      PT LONG TERM GOAL #5   Title  Patient will  demonstrate ability to climb and descend 12 steps (full flight of stairs - 4 steps, 3 reps) with modified independence to demonstrate a decrease in fall risk.    Time  8    Period  Weeks    Status  New      Additional Long Term Goals   Additional Long Term Goals  Yes      PT LONG TERM GOAL #6   Title  Patient will demonstrate ability to maintain single limb stance on each lower extremity for >/= 10 seconds safety to indicate improvement in balance and decreae in fall risk.    Time  8    Period  Weeks    Status  New            Plan - 06/09/19 1014    Clinical Impression Statement  Focus of today's skilled session was stepping strategies, balance with head motions and SLS. Pt continues to have difficulties with posterior stepping strategy, esp when on a compliant surface, needing min guard for balance. Pt able to improve with SLS activities on compliant surfaces esp with increased reps. Pt able to respond well using hip strategy on rockerboard with head motions after initial cueing. Pt remains very motivated - will continue to progress towards LTGs.    Personal Factors and Comorbidities  Age;Comorbidity 1    Comorbidities  heart murmur, B TKAs, spinal stenosis    Examination-Activity Limitations  Bend;Lift;Squat;Locomotion Level;Stairs;Reach Overhead;Carry;Stand    Examination-Participation Restrictions  Cleaning;Laundry;Shop;Driving;Meal Prep;Community Activity    Stability/Clinical Decision Making  Evolving/Moderate complexity    Rehab Potential  Excellent    PT Frequency  2x / week    PT Duration  8 weeks    PT Treatment/Interventions  ADLs/Self Care Home Management;Aquatic Therapy;Moist Heat;Electrical Stimulation;Cryotherapy;Ultrasound;DME Instruction;Gait training;Therapeutic exercise;Therapeutic activities;Functional mobility training;Stair training;Balance training;Neuromuscular re-education;Patient/family education;Manual techniques;Orthotic Fit/Training;Passive range of  motion;Energy conservation    PT Next Visit Plan  corner balance, posterior stepping strategies and balance with head motions, single limb stance, challenge vestibular system reliance for balance maintenance, tandem gait, high level balance, gait with changes in direction/sudden starts and stops    Consulted and Agree with Plan of Care  Patient       Patient will benefit from skilled therapeutic intervention in order to improve the following deficits and impairments:  Abnormal gait, Decreased balance, Decreased endurance, Decreased mobility, Difficulty walking, Decreased knowledge of precautions, Decreased range of motion, Impaired perceived functional ability, Improper body mechanics, Decreased activity tolerance, Decreased knowledge of use of DME, Decreased safety awareness, Decreased strength, Impaired flexibility, Postural dysfunction  Visit Diagnosis: Muscle weakness (generalized)  Other abnormalities of gait and mobility  Unsteadiness on feet  Difficulty in walking, not elsewhere classified     Problem List Patient Active Problem List   Diagnosis Date Noted  . Asthma, chronic 04/18/2013  . HTN (hypertension) 04/18/2013    Arliss Journey, PT, DPT  06/09/2019, 10:16 AM  [facility] [address] [place], [place], [address] Phone: [phone]   Fax:  [phone]  Name: [name] MRN: [id number] Date of Birth: 04-12-1945

## 2019-06-10 ENCOUNTER — Other Ambulatory Visit: Payer: Self-pay

## 2019-06-10 ENCOUNTER — Telehealth: Payer: Self-pay | Admitting: *Deleted

## 2019-06-10 MED ORDER — FLUCONAZOLE 10 MG/ML PO SUSR: 200.0000 mg | ORAL | 6 refills | Status: DC

## 2019-06-10 NOTE — Telephone Encounter (Signed)
Fax from pharmacy patient wants compounded fluconazole dmso like she has had before. Will send in new prescription.

## 2019-06-10 NOTE — Telephone Encounter (Signed)
Ok to refill per [name] Fluconazole 200mg /58ml qty 10ml Apply 1-2 drops under nail as directed refill x6

## 2019-06-11 ENCOUNTER — Other Ambulatory Visit: Payer: Self-pay | Admitting: *Deleted

## 2019-06-11 MED ORDER — NONFORMULARY OR COMPOUNDED ITEM: 1 refills | Status: DC

## 2019-06-13 ENCOUNTER — Ambulatory Visit: Payer: Medicare PPO | Admitting: Physical Therapy

## 2019-06-13 ENCOUNTER — Other Ambulatory Visit: Payer: Self-pay

## 2019-06-13 DIAGNOSIS — M6281 Muscle weakness (generalized): Secondary | ICD-10-CM | POA: Diagnosis not present

## 2019-06-13 DIAGNOSIS — R2681 Unsteadiness on feet: Secondary | ICD-10-CM

## 2019-06-13 DIAGNOSIS — R262 Difficulty in walking, not elsewhere classified: Secondary | ICD-10-CM

## 2019-06-13 DIAGNOSIS — R2689 Other abnormalities of gait and mobility: Secondary | ICD-10-CM

## 2019-06-13 NOTE — Therapy (Signed)
[facility] [address] [place], [place], [address] Phone: [phone]   Fax:  [phone]  Physical Therapy Treatment  Patient Details  Name: [name] MRN: [id number] Date of Birth: 01-Jul-1945 Referring Provider (PT): [place]   Encounter Date: 06/13/2019  PT End of Session - 06/13/19 1056    Visit Number  7    Number of Visits  17    Date for PT Re-Evaluation  07/22/19    Authorization Type  [employer]   10th visit progress note   PT Start Time  1017    PT Stop Time  1056    PT Time Calculation (min)  39 min    Equipment Utilized During Treatment  Gait belt    Activity Tolerance  Patient tolerated treatment well    Behavior During Therapy  WFL for tasks assessed/performed       Past Medical History:  Diagnosis Date  . Allergy    Hay fever/bee stings  . Anxiety   . Arthritis   . Asthma   . Blood transfusion without reported diagnosis 2012  . Cataract   . Depression   . GERD (gastroesophageal reflux disease)   . Heart murmur   . Hypertension     Past Surgical History:  Procedure Laterality Date  . ABDOMINAL HYSTERECTOMY  1986  . MELANOMA EXCISION  2009   left shoulder near neck  . REPLACEMENT TOTAL KNEE BILATERAL  05/2010 &05/2010    There were no vitals filed for this visit.  Subjective Assessment - 06/13/19 1019    Subjective  Had a busy week. Has been doing a bunch of walking this week.    Pertinent History  hx significant for falls, heart murmur, s/p B TKAs (most recent in 2012), spinal stenosis    Limitations  Standing;Walking;Lifting;House hold activities    How long can you walk comfortably?  2-3 miles - I walk this with a friend 2x/wk    Patient Stated Goals  I want to get my balance back; I want to be steadier on my feet and not worry about falling down    Currently in Pain?  No/denies                        [facility] Adult PT Treatment/Exercise -  06/13/19 0001      Ambulation/Gait   Ambulation/Gait  Yes    Ambulation/Gait Assistance  4: Min guard;5: Supervision    Ambulation/Gait Assistance Details  gait outdoors with scanning environment and performing faster and slower gait speeds with start/stops - min guard for balance. 2 x 30' marching in grass with cues for slowed and controlled for SLS - min guard for balance. x100' dual tasking while tossing ball and naming animals from A-Z, no LOB     Ambulation Distance (Feet)  600 Feet    Assistive device  None    Gait Pattern  Decreased arm swing - right;Decreased arm swing - left;Decreased step length - right;Decreased step length - left;Decreased dorsiflexion - right;Trendelenburg;Poor foot clearance - right    Ambulation Surface  Level;Indoor;Outdoor;Grass;Unlevel      Neuro Re-ed    Neuro Re-ed Details   On rockerboard in A/P direction: standing on board x10 reps tapping anterior cone on R and then performed on L for SLS and ankle strategy, progressing to stepping onto rocker board, maintaining balance and then tapping forwards cone x10 reps B with min guard/min A for balance.  Cues for slowed and controlled. On blue compliant mat next to countertop: reciprocal stepping over 3 smaller hurdles with no UE support, down and back 4 reps, with no UE support, cues for slowed and controlled and incr hip/knee flexion when clearing obstacles.           Balance Exercises - 06/13/19 1042      Balance Exercises: Standing   Standing Eyes Closed  Wide (BOA);Foam/compliant surface;3 reps;Limitations    Standing Eyes Closed Limitations  on thicker blue pad, 1st rep 15 seconds, 2nd rep 24 seconds, 3rd rep 30 seconds    Tandem Gait  Intermittent upper extremity support;3 reps;Foam/compliant surface;Limitations    Tandem Gait Limitations  on blue foam beam    Other Standing Exercises  standing on thick blue foam with eyes open and more narrow BOS 1 x 10 reps head nods, 1 x 10 reps head turns, x10 reps  alternating marching    Other Standing Exercises Comments  on blue foam beam tandem stance holds B: 3 x 10 seconds each with intermittent UE support, min guard/min A for balance          PT Short Term Goals - 05/23/19 1242      PT SHORT TERM GOAL #1   Title  Patient will be independent with HEP to foster lower extremity strength and balance to decrease fall risk and improve strength. (ALL STGs due 06/22/19)    Time  4    Period  Weeks    Status  New    Target Date  06/22/19      PT SHORT TERM GOAL #2   Title  Patient will score >/= 19/30 on the FGA to indicate a decrease in risk for falls.    Time  4    Period  Weeks    Status  New      PT SHORT TERM GOAL #3   Title  Patient will demonstrate ability to ambulate >/= 500 feet including indoor and outdoor level surfaces with supervision to indicate a decrease in fall risk with community ambulation.    Time  4    Period  Weeks    Status  New      PT SHORT TERM GOAL #4   Title  Patient will demonstrate ability to navigate 8 steps (4 steps, 2 sets) with supervision and unilateral railing to indicate a decrease in fall risk with stair negotiation.    Time  4    Period  Weeks    Status  New      PT SHORT TERM GOAL #5   Title  Patient will demonstrate ability to maintain single limb stance on each lower extremity for >/= 5 seconds safety to indicate improvement in balance and decreae in fall risk.    Time  4    Period  Weeks    Status  New        PT Long Term Goals - 05/23/19 1235      PT LONG TERM GOAL #1   Title  Patient will be independent with HEP to foster lower extremity strength and balance to decrease fall risk and improve strength. (ALL LTGs DUE 07/22/19)    Time  8    Period  Weeks    Status  New    Target Date  07/22/19      PT LONG TERM GOAL #2   Title  Patient will score >/= 24/30 on the FGA to demonstrate a decrease in fall risk.  Time  8    Period  Weeks    Status  New      PT LONG TERM GOAL #3    Title  Patient's gait velocity will be >/= 3.44 ft/sec with normalized gait pattern to demonstrate increased safety when ambulating in the community.    Time  8    Period  Weeks    Status  New      PT LONG TERM GOAL #4   Title  Patient will demonstrate ability to ambulate >/= 1000 feet including indoor, outdoor, level, unlevel surfaces to demonstrate a decrease in risk of falls when ambulating for exercise/leisure with friends.    Time  8    Period  Weeks    Status  New      PT LONG TERM GOAL #5   Title  Patient will demonstrate ability to climb and descend 12 steps (full flight of stairs - 4 steps, 3 reps) with modified independence to demonstrate a decrease in fall risk.    Time  8    Period  Weeks    Status  New      Additional Long Term Goals   Additional Long Term Goals  Yes      PT LONG TERM GOAL #6   Title  Patient will demonstrate ability to maintain single limb stance on each lower extremity for >/= 10 seconds safety to indicate improvement in balance and decreae in fall risk.    Time  8    Period  Weeks    Status  New            Plan - 06/13/19 1205    Clinical Impression Statement  Focus of today's skilled session was dynamic gait training with dual tasking and balance strategies on compliant surfaces, with focus on SLS. Pt needing only min guard today with dual tasking/coordination activities today with gait over grass. Pt improves with SLS with incr reps on compliant surfaces, however needed cues for slowed and controlled movement over hurdles and for hip/knee flexion to avoid compensatory movement when clearing obstacles. Pt is progressing well - will continue to progress towards LTGs.    Personal Factors and Comorbidities  Age;Comorbidity 1    Comorbidities  heart murmur, B TKAs, spinal stenosis    Examination-Activity Limitations  Bend;Lift;Squat;Locomotion Level;Stairs;Reach Overhead;Carry;Stand    Examination-Participation Restrictions   Cleaning;Laundry;Shop;Driving;Meal Prep;Community Activity    Stability/Clinical Decision Making  Evolving/Moderate complexity    Rehab Potential  Excellent    PT Frequency  2x / week    PT Duration  8 weeks    PT Treatment/Interventions  ADLs/Self Care Home Management;Aquatic Therapy;Moist Heat;Electrical Stimulation;Cryotherapy;Ultrasound;DME Instruction;Gait training;Therapeutic exercise;Therapeutic activities;Functional mobility training;Stair training;Balance training;Neuromuscular re-education;Patient/family education;Manual techniques;Orthotic Fit/Training;Passive range of motion;Energy conservation    PT Next Visit Plan  corner balance, posterior stepping strategies and balance with head motions, single limb stance, challenge vestibular system reliance for balance maintenance, tandem gait, high level balance, gait with changes in direction/sudden starts and stops    Consulted and Agree with Plan of Care  Patient       Patient will benefit from skilled therapeutic intervention in order to improve the following deficits and impairments:  Abnormal gait, Decreased balance, Decreased endurance, Decreased mobility, Difficulty walking, Decreased knowledge of precautions, Decreased range of motion, Impaired perceived functional ability, Improper body mechanics, Decreased activity tolerance, Decreased knowledge of use of DME, Decreased safety awareness, Decreased strength, Impaired flexibility, Postural dysfunction  Visit Diagnosis: Muscle weakness (generalized)  Other abnormalities of  gait and mobility  Unsteadiness on feet  Difficulty in walking, not elsewhere classified     Problem List Patient Active Problem List   Diagnosis Date Noted  . Asthma, chronic 04/18/2013  . HTN (hypertension) 04/18/2013    Arliss Journey, PT, DPT  06/13/2019, 12:07 PM  [facility] [address] [place], [place], [address] Phone:  [phone]   Fax:  [phone]  Name: [name] MRN: [id number] Date of Birth: 11-Oct-1945

## 2019-06-16 ENCOUNTER — Other Ambulatory Visit: Payer: Self-pay

## 2019-06-16 ENCOUNTER — Ambulatory Visit: Payer: Medicare PPO | Admitting: Physical Therapy

## 2019-06-16 DIAGNOSIS — R2681 Unsteadiness on feet: Secondary | ICD-10-CM

## 2019-06-16 DIAGNOSIS — M6281 Muscle weakness (generalized): Secondary | ICD-10-CM

## 2019-06-16 DIAGNOSIS — R2689 Other abnormalities of gait and mobility: Secondary | ICD-10-CM

## 2019-06-16 NOTE — Patient Instructions (Signed)
Access Code: [email or web address] URL: [email or web address] Date: 05/26/2019 Prepared by: Janann August  Exercises Standing Marching - 1 x daily - 4 x weekly - 3 sets Heel Walking - 1 x daily - 4 x weekly - 3 sets Backward Walking with Counter Support - 4 x weekly - 3 sets Tandem Walking with Counter Support - 1 x daily - 4 x weekly - 3 sets Standing Balance with Eyes Closed on Foam - 1 x daily - 5 x weekly - 2 sets - 10 reps Standing Romberg to 3/4 Tandem Stance - 2 x daily - 5 x weekly - 3 sets - 10-15 hold

## 2019-06-16 NOTE — Therapy (Signed)
[facility] [address] [place], [place], [address] Phone: [phone]   Fax:  [phone]  Physical Therapy Treatment  Patient Details  Name: [name] MRN: [id number] Date of Birth: [birth year] Referring Provider (PT): [facility] [place]   Encounter Date: 06/16/2019  PT End of Session - 06/16/19 1639    Visit Number  8    Number of Visits  17    Date for PT Re-Evaluation  07/22/19    Authorization Type  [employer]   10th visit progress note   PT Start Time  1320    PT Stop Time  1401    PT Time Calculation (min)  41 min    Equipment Utilized During Treatment  Gait belt    Activity Tolerance  Patient tolerated treatment well    Behavior During Therapy  [facility] for tasks assessed/performed       Past Medical History:  Diagnosis Date  . Allergy    Hay fever/bee stings  . Anxiety   . Arthritis   . Asthma   . Blood transfusion without reported diagnosis 2012  . Cataract   . Depression   . GERD (gastroesophageal reflux disease)   . Heart murmur   . Hypertension     Past Surgical History:  Procedure Laterality Date  . ABDOMINAL HYSTERECTOMY  1986  . MELANOMA EXCISION  2009   left shoulder near neck  . REPLACEMENT TOTAL KNEE BILATERAL  05/2010 &05/2010    There were no vitals filed for this visit.  Subjective Assessment - 06/16/19 1323    Subjective  Had an almost fall, but was able to catch herself- was going down a curb and the other one was talking and changing directions. Exercises are going well.    Pertinent History  hx significant for falls, heart murmur, s/p B TKAs (most recent in 2012), spinal stenosis    Limitations  Standing;Walking;Lifting;House hold activities    How long can you walk comfortably?  2-3 miles - I walk this with a friend 2x/wk    Patient Stated Goals  I want to get my balance back; I want to be steadier on my feet and not worry about falling down    Currently in Pain?   No/denies                      Access Code: [email or web address] URL: [email or web address] Date: 05/26/2019 Prepared by: Janann August  Bolded below are new additions to HEP:   Exercises Standing Marching - 1 x daily - 4 x weekly - 3 sets - reviewed with cues for pt to hold each march for 3 seconds for incr SLS  Heel Walking - 1 x daily - 4 x weekly - 3 sets Backward Walking with Counter Support - 4 x weekly - 3 sets Tandem Walking with Counter Support - 1 x daily - 4 x weekly - 3 sets Standing Balance with Eyes Closed on Foam - 1 x daily - 5 x weekly - 2 sets - 10 reps - on 2 pillows with feet apart, eyes closed 2 x 10 reps head turns, 2 x 10 reps head nods  Standing Romberg to 3/4 Tandem Stance - 2 x daily - 5 x weekly - 3 sets - 10-15 hold - widened modified tandem stance on level ground with eyes closed 3 x 10-15 seconds B     [facility] Adult PT Treatment/Exercise - 06/16/19 0001      Ambulation/Gait  Curb  5: Supervision   min guard   Curb Details (indicate cue type and reason)  cues for technique and sequencing, x5 reps           Balance Exercises - 06/16/19 1643      Balance Exercises: Standing   Step Ups  Forward;Lateral;6 inch;UE support 2;Limitations    Step Ups Limitations  at staircase - x10 reps forward step ups B with cues to "float" non-stance leg for SLS, and then performing lateral step ups with heel tap down to ground with BUE support x10 reps B    Tandem Gait  Intermittent upper extremity support;3 reps    Tandem Gait Limitations  at countertop - forward 3 reps and retro tandem gait x3 reps         PT Education - 06/16/19 1639    Education Details  new additions to HEP    Person(s) Educated  Patient    Methods  Explanation;Demonstration;Handout    Comprehension  Verbalized understanding;Returned demonstration       PT Short Term Goals - 06/16/19 1645      PT SHORT TERM GOAL #1   Title  Patient will be independent with HEP to  foster lower extremity strength and balance to decrease fall risk and improve strength. (ALL STGs due 06/22/19)    Baseline  met for initial HEP on 06/16/19, provided pt with upgrades    Time  4    Period  Weeks    Status  Achieved    Target Date  06/22/19      PT SHORT TERM GOAL #2   Title  Patient will score >/= 19/30 on the FGA to indicate a decrease in risk for falls.    Time  4    Period  Weeks    Status  New      PT SHORT TERM GOAL #3   Title  Patient will demonstrate ability to ambulate >/= 500 feet including indoor and outdoor level surfaces with supervision to indicate a decrease in fall risk with community ambulation.    Time  4    Period  Weeks    Status  New      PT SHORT TERM GOAL #4   Title  Patient will demonstrate ability to navigate 8 steps (4 steps, 2 sets) with supervision and unilateral railing to indicate a decrease in fall risk with stair negotiation.    Time  4    Period  Weeks    Status  New      PT SHORT TERM GOAL #5   Title  Patient will demonstrate ability to maintain single limb stance on each lower extremity for >/= 5 seconds safety to indicate improvement in balance and decreae in fall risk.    Baseline  approx. 3-4 seconds B.    Time  4    Period  Weeks    Status  Not Met        PT Long Term Goals - 05/23/19 1235      PT LONG TERM GOAL #1   Title  Patient will be independent with HEP to foster lower extremity strength and balance to decrease fall risk and improve strength. (ALL LTGs DUE 07/22/19)    Time  8    Period  Weeks    Status  New    Target Date  07/22/19      PT LONG TERM GOAL #2   Title  Patient will score >/= 24/30 on the FGA  to demonstrate a decrease in fall risk.    Time  8    Period  Weeks    Status  New      PT LONG TERM GOAL #3   Title  Patient's gait velocity will be >/= 3.44 ft/sec with normalized gait pattern to demonstrate increased safety when ambulating in the community.    Time  8    Period  Weeks    Status  New       PT LONG TERM GOAL #4   Title  Patient will demonstrate ability to ambulate >/= 1000 feet including indoor, outdoor, level, unlevel surfaces to demonstrate a decrease in risk of falls when ambulating for exercise/leisure with friends.    Time  8    Period  Weeks    Status  New      PT LONG TERM GOAL #5   Title  Patient will demonstrate ability to climb and descend 12 steps (full flight of stairs - 4 steps, 3 reps) with modified independence to demonstrate a decrease in fall risk.    Time  8    Period  Weeks    Status  New      Additional Long Term Goals   Additional Long Term Goals  Yes      PT LONG TERM GOAL #6   Title  Patient will demonstrate ability to maintain single limb stance on each lower extremity for >/= 10 seconds safety to indicate improvement in balance and decreae in fall risk.    Time  8    Period  Weeks    Status  New            Plan - 06/16/19 1645    Clinical Impression Statement  Assessed curb training with pt as pt reports she almost had a fall over this past weekend, but was able to keep her balance. Pt needing initial cues for proper sequencing and technique, min guard initially progressing to supervision with pt having no LOB. Upgraded pt's HEP today to include higher level balance to challenge pt's vestibular system with eyes closed and with head motions. Pt has met STG #1 today, assessed STG #5 with pt only able to hold SLS B for an average of 4-5 seocnds today. Pt remains very motivated - will continue to progress towards LTGs.    Personal Factors and Comorbidities  Age;Comorbidity 1    Comorbidities  heart murmur, B TKAs, spinal stenosis    Examination-Activity Limitations  Bend;Lift;Squat;Locomotion Level;Stairs;Reach Overhead;Carry;Stand    Examination-Participation Restrictions  Cleaning;Laundry;Shop;Driving;Meal Prep;Community Activity    Stability/Clinical Decision Making  Evolving/Moderate complexity    Rehab Potential  Excellent    PT  Frequency  2x / week    PT Duration  8 weeks    PT Treatment/Interventions  ADLs/Self Care Home Management;Aquatic Therapy;Moist Heat;Electrical Stimulation;Cryotherapy;Ultrasound;DME Instruction;Gait training;Therapeutic exercise;Therapeutic activities;Functional mobility training;Stair training;Balance training;Neuromuscular re-education;Patient/family education;Manual techniques;Orthotic Fit/Training;Passive range of motion;Energy conservation    PT Next Visit Plan  check remainder of STGs. how were new additions to HEP? corner balance, posterior stepping strategies and balance with head motions, single limb stance, challenge vestibular system reliance for balance maintenance, tandem gait, high level balance, gait with changes in direction/sudden starts and stops    PT Home Exercise Plan  [email or web address]    Consulted and Agree with Plan of Care  Patient       Patient will benefit from skilled therapeutic intervention in order to improve the following deficits and impairments:  Abnormal gait, Decreased  balance, Decreased endurance, Decreased mobility, Difficulty walking, Decreased knowledge of precautions, Decreased range of motion, Impaired perceived functional ability, Improper body mechanics, Decreased activity tolerance, Decreased knowledge of use of DME, Decreased safety awareness, Decreased strength, Impaired flexibility, Postural dysfunction  Visit Diagnosis: Muscle weakness (generalized)  Other abnormalities of gait and mobility  Unsteadiness on feet     Problem List Patient Active Problem List   Diagnosis Date Noted  . Asthma, chronic 04/18/2013  . HTN (hypertension) 04/18/2013    Lillia Pauls, DPT  06/16/2019, 4:53 PM  [facility] [address] [place], [place], [address] Phone: [phone]   Fax:  [phone]  Name: [name] MRN: [id number] Date of Birth: [birth year]

## 2019-06-20 ENCOUNTER — Ambulatory Visit: Payer: Medicare PPO | Admitting: Rehabilitative and Restorative Service Providers"

## 2019-06-20 ENCOUNTER — Encounter: Payer: Self-pay | Admitting: Rehabilitative and Restorative Service Providers"

## 2019-06-20 ENCOUNTER — Other Ambulatory Visit: Payer: Self-pay

## 2019-06-20 DIAGNOSIS — M6281 Muscle weakness (generalized): Secondary | ICD-10-CM

## 2019-06-20 DIAGNOSIS — R2689 Other abnormalities of gait and mobility: Secondary | ICD-10-CM

## 2019-06-20 DIAGNOSIS — Z9181 History of falling: Secondary | ICD-10-CM

## 2019-06-20 DIAGNOSIS — R2681 Unsteadiness on feet: Secondary | ICD-10-CM

## 2019-06-20 DIAGNOSIS — R262 Difficulty in walking, not elsewhere classified: Secondary | ICD-10-CM

## 2019-06-20 NOTE — Therapy (Signed)
[facility] [address] [place], [place], [address] Phone: [phone]   Fax:  [phone]  Physical Therapy Treatment  Patient Details  Name: [name] MRN: [id number] Date of Birth: Mar 26, 1945 Referring Provider (PT): [place]   Encounter Date: 06/20/2019  PT End of Session - 06/20/19 1305    Visit Number  9    Number of Visits  17    Date for PT Re-Evaluation  07/22/19    Authorization Type  [employer]   10th visit progress note   PT Start Time  1105    PT Stop Time  1143    PT Time Calculation (min)  38 min    Equipment Utilized During Treatment  Gait belt    Activity Tolerance  Patient tolerated treatment well    Behavior During Therapy  [facility] for tasks assessed/performed       Past Medical History:  Diagnosis Date  . Allergy    Hay fever/bee stings  . Anxiety   . Arthritis   . Asthma   . Blood transfusion without reported diagnosis 2012  . Cataract   . Depression   . GERD (gastroesophageal reflux disease)   . Heart murmur   . Hypertension     Past Surgical History:  Procedure Laterality Date  . ABDOMINAL HYSTERECTOMY  1986  . MELANOMA EXCISION  2009   left shoulder near neck  . REPLACEMENT TOTAL KNEE BILATERAL  05/2010 &05/2010    There were no vitals filed for this visit.  Subjective Assessment - 06/20/19 1109    Subjective  Denies any near falls since last visit. Has been working on HEP with consistency.    Pertinent History  hx significant for falls, heart murmur, s/p B TKAs (most recent in 2012), spinal stenosis    Limitations  Standing;Walking;Lifting;House hold activities    How long can you walk comfortably?  2-3 miles - I walk this with a friend 2x/wk    Patient Stated Goals  I want to get my balance back; I want to be steadier on my feet and not worry about falling down    Currently in Pain?  No/denies         [facility] PT Assessment - 06/20/19 1118       Functional Gait  Assessment   Gait assessed   Yes    Gait Level Surface  Walks 20 ft in less than 7 sec but greater than 5.5 sec, uses assistive device, slower speed, mild gait deviations, or deviates 6-10 in outside of the 12 in walkway width.    Change in Gait Speed  Able to smoothly change walking speed without loss of balance or gait deviation. Deviate no more than 6 in outside of the 12 in walkway width.    Gait with Horizontal Head Turns  Performs head turns smoothly with slight change in gait velocity (eg, minor disruption to smooth gait path), deviates 6-10 in outside 12 in walkway width, or uses an assistive device.    Gait with Vertical Head Turns  Performs task with slight change in gait velocity (eg, minor disruption to smooth gait path), deviates 6 - 10 in outside 12 in walkway width or uses assistive device    Gait and Pivot Turn  Pivot turns safely in greater than 3 sec and stops with no loss of balance, or pivot turns safely within 3 sec and stops with mild imbalance, requires small steps to catch balance.  Step Over Obstacle  Is able to step over one shoe box (4.5 in total height) without changing gait speed. No evidence of imbalance.    Gait with Narrow Base of Support  Ambulates less than 4 steps heel to toe or cannot perform without assistance.    Gait with Eyes Closed  Walks 20 ft, uses assistive device, slower speed, mild gait deviations, deviates 6-10 in outside 12 in walkway width. Ambulates 20 ft in less than 9 sec but greater than 7 sec.    Ambulating Backwards  Walks 20 ft, uses assistive device, slower speed, mild gait deviations, deviates 6-10 in outside 12 in walkway width.    Steps  Alternating feet, must use rail.    Total Score  19    FGA comment:  19-24 = medium risk fall (improved from baseline score of 14/30 at initial evaluation)         [facility] Adult PT Treatment/Exercise - 06/20/19 1311      Ambulation/Gait   Ambulation/Gait  Yes    Ambulation/Gait  Assistance  5: Supervision    Ambulation/Gait Assistance Details  Ambulated indoors and outdoors gait requiring S with intermittent cueing for her to attend to changes in the surface (inclines, "reset" as she veers to the left, etc.) but no physical assistance required. When dual tasking, tends to veer to right with poorer attendance to ground/surface changes/etc. on/to her right side     Ambulation Distance (Feet)  500 Feet   500 x 1, 115 x 4    Assistive device  None    Gait Pattern  Decreased arm swing - right;Decreased arm swing - left;Decreased step length - right;Decreased step length - left;Decreased dorsiflexion - right;Trendelenburg;Poor foot clearance - right    Ambulation Surface  Level;Unlevel;Indoor;Outdoor;Paved;Grass    Stairs  Yes    Stairs Assistance  5: Supervision;4: Min guard    Stair Management Technique  No rails;One rail Right;Alternating pattern;Forwards   see details, above    Number of Stairs  8    Height of Stairs  6      Neuro Re-ed    Neuro Re-ed Details    fwd foot taps without UE support on bottom step of stairs (6 inches) x 10 bilaterally - requiring close S from PT for safety with intermittent min guard; split stance with fwd foot on bottom step of stairs (6 inch step) x 30 second hold, 2 reps bilaterally requiring close S for safety due to increased sway (especially when in single limb stance on right side)                     PT Education - 06/20/19 1304    Education Details  findings from today's STG check    Person(s) Educated  Patient    Methods  Explanation    Comprehension  Verbalized understanding       PT Short Term Goals - 06/20/19 1110      PT SHORT TERM GOAL #1   Title  Patient will be independent with HEP to foster lower extremity strength and balance to decrease fall risk and improve strength. (ALL STGs due 06/22/19)    Baseline  met for initial HEP on 06/16/19, provided pt with upgrades    Time  4    Period  Weeks    Status   Achieved    Target Date  06/22/19      PT SHORT TERM GOAL #2   Title  Patient will score >/=   19/30 on the FGA to indicate a decrease in risk for falls.    Baseline  MET 06/20/19 FGA = 19/30    Time  4    Period  Weeks    Status  Achieved      PT SHORT TERM GOAL #3   Title  Patient will demonstrate ability to ambulate >/= 500 feet including indoor and outdoor level surfaces with supervision to indicate a decrease in fall risk with community ambulation.    Baseline  MET 06/20/19    Time  4    Period  Weeks    Status  Achieved      PT SHORT TERM GOAL #4   Title  Patient will demonstrate ability to navigate 8 steps (4 steps, 2 sets) with supervision and unilateral railing to indicate a decrease in fall risk with stair negotiation.    Baseline  MET 06/20/19    Time  4    Period  Weeks    Status  Achieved      PT SHORT TERM GOAL #5   Title  Patient will demonstrate ability to maintain single limb stance on each lower extremity for >/= 5 seconds safety to indicate improvement in balance and decreae in fall risk.    Baseline  approx. 3-4 seconds B.    Time  4    Period  Weeks    Status  Not Met        PT Long Term Goals - 05/23/19 1235      PT LONG TERM GOAL #1   Title  Patient will be independent with HEP to foster lower extremity strength and balance to decrease fall risk and improve strength. (ALL LTGs DUE 07/22/19)    Time  8    Period  Weeks    Status  New    Target Date  07/22/19      PT LONG TERM GOAL #2   Title  Patient will score >/= 24/30 on the FGA to demonstrate a decrease in fall risk.    Time  8    Period  Weeks    Status  New      PT LONG TERM GOAL #3   Title  Patient's gait velocity will be >/= 3.44 ft/sec with normalized gait pattern to demonstrate increased safety when ambulating in the community.    Time  8    Period  Weeks    Status  New      PT LONG TERM GOAL #4   Title  Patient will demonstrate ability to ambulate >/= 1000 feet including indoor,  outdoor, level, unlevel surfaces to demonstrate a decrease in risk of falls when ambulating for exercise/leisure with friends.    Time  8    Period  Weeks    Status  New      PT LONG TERM GOAL #5   Title  Patient will demonstrate ability to climb and descend 12 steps (full flight of stairs - 4 steps, 3 reps) with modified independence to demonstrate a decrease in fall risk.    Time  8    Period  Weeks    Status  New      Additional Long Term Goals   Additional Long Term Goals  Yes      PT LONG TERM GOAL #6   Title  Patient will demonstrate ability to maintain single limb stance on each lower extremity for >/= 10 seconds safety to indicate improvement in balance and decreae in fall   risk.    Time  8    Period  Weeks    Status  New            Plan - 06/20/19 1306    Clinical Impression Statement  Today's skilled session focused initially on checking STGs, of which patient has met 4 of 5 goals with her one unmet goal being maintenance of single limb stance. Therefore, following full STG assessment today, PT progressed NMR activity fostering strengthening wiht heavy bias towards single limb stance, which she tolerated well, but was challenged by fatigue. Patient's FGA score today of 19/30 indicates she is at a medium risk for falls, which is much improved from her baseline score of 14/30. She is demonstrating progress and will benefit from continued skilled PT working towards LTGs and to reduce fall risk.    Personal Factors and Comorbidities  Age;Comorbidity 1    Comorbidities  heart murmur, B TKAs, spinal stenosis    Examination-Activity Limitations  Bend;Lift;Squat;Locomotion Level;Stairs;Reach Overhead;Carry;Stand    Examination-Participation Restrictions  Cleaning;Laundry;Shop;Driving;Meal Prep;Community Activity    Stability/Clinical Decision Making  Evolving/Moderate complexity    Rehab Potential  Excellent    PT Frequency  2x / week    PT Duration  8 weeks    PT  Treatment/Interventions  ADLs/Self Care Home Management;Aquatic Therapy;Moist Heat;Electrical Stimulation;Cryotherapy;Ultrasound;DME Instruction;Gait training;Therapeutic exercise;Therapeutic activities;Functional mobility training;Stair training;Balance training;Neuromuscular re-education;Patient/family education;Manual techniques;Orthotic Fit/Training;Passive range of motion;Energy conservation    PT Next Visit Plan  10th visit progress note due next visit!!, working towards single limb stance tolerance/maintenance, corner balance, posterior stepping strategies and balance with head motions, single limb stance, challenge vestibular system reliance for balance maintenance, tandem gait, high level balance, gait with changes in direction/sudden starts and stops    PT Home Exercise Plan  [id number]    Consulted and Agree with Plan of Care  Patient       Patient will benefit from skilled therapeutic intervention in order to improve the following deficits and impairments:  Abnormal gait, Decreased balance, Decreased endurance, Decreased mobility, Difficulty walking, Decreased knowledge of precautions, Decreased range of motion, Impaired perceived functional ability, Improper body mechanics, Decreased activity tolerance, Decreased knowledge of use of DME, Decreased safety awareness, Decreased strength, Impaired flexibility, Postural dysfunction  Visit Diagnosis: Muscle weakness (generalized)  Other abnormalities of gait and mobility  Unsteadiness on feet  Difficulty in walking, not elsewhere classified  History of falling     Problem List Patient Active Problem List   Diagnosis Date Noted  . Asthma, chronic 04/18/2013  . HTN (hypertension) 04/18/2013    Virginia W Shoesmith, PT, DPT  06/20/2019, 1:21 PM  [facility] [address] [place], [place], [address] Phone: [phone]   Fax:  [phone]  Name: Laquasha U  Stirn MRN: [id number] Date of Birth: 07/12/1945   

## 2019-06-23 ENCOUNTER — Ambulatory Visit: Payer: Medicare PPO | Admitting: Physical Therapy

## 2019-06-23 ENCOUNTER — Other Ambulatory Visit: Payer: Self-pay

## 2019-06-23 DIAGNOSIS — R2681 Unsteadiness on feet: Secondary | ICD-10-CM

## 2019-06-23 DIAGNOSIS — M6281 Muscle weakness (generalized): Secondary | ICD-10-CM

## 2019-06-23 DIAGNOSIS — R262 Difficulty in walking, not elsewhere classified: Secondary | ICD-10-CM

## 2019-06-23 DIAGNOSIS — R2689 Other abnormalities of gait and mobility: Secondary | ICD-10-CM

## 2019-06-23 NOTE — Therapy (Signed)
[facility] [address] [place], [place], [address] Phone: [phone]   Fax:  [phone]  Physical Therapy Treatment/10th Visit Progress Note  Patient Details  Name: [name] MRN: [id number] Date of Birth: April 09, 1945 Referring Provider (PT): [place]  10th Visit Physical Therapy Progress Note  Dates of Reporting Period: 05/21/19 to 06/23/19     Encounter Date: 06/23/2019  PT End of Session - 06/23/19 1021    Visit Number  10    Number of Visits  17    Date for PT Re-Evaluation  07/22/19    Authorization Type  [employer]   10th visit progress note   PT Start Time  0935    PT Stop Time  1015    PT Time Calculation (min)  40 min    Equipment Utilized During Treatment  Gait belt    Activity Tolerance  Patient tolerated treatment well    Behavior During Therapy  [facility] for tasks assessed/performed       Past Medical History:  Diagnosis Date  . Allergy    Hay fever/bee stings  . Anxiety   . Arthritis   . Asthma   . Blood transfusion without reported diagnosis 2012  . Cataract   . Depression   . GERD (gastroesophageal reflux disease)   . Heart murmur   . Hypertension     Past Surgical History:  Procedure Laterality Date  . ABDOMINAL HYSTERECTOMY  1986  . MELANOMA EXCISION  2009   left shoulder near neck  . REPLACEMENT TOTAL KNEE BILATERAL  05/2010 &05/2010    There were no vitals filed for this visit.  Subjective Assessment - 06/23/19 0936    Subjective  Reports it is still difficult standing on one foot and also walking down stairs. No falls or almost falls.    Pertinent History  hx significant for falls, heart murmur, s/p B TKAs (most recent in 2012), spinal stenosis    Limitations  Standing;Walking;Lifting;House hold activities    How long can you walk comfortably?  2-3 miles - I walk this with a friend 2x/wk    Patient Stated Goals  I want to get my balance back; I want to be  steadier on my feet and not worry about falling down    Currently in Pain?  No/denies                        [facility] Adult PT Treatment/Exercise - 06/23/19 0001      Neuro Re-ed    Neuro Re-ed Details   In long hallway: stepping over 7 obstacles over various heights with focus on maintaining gait speed and step length, then adding in blue compliant mat for changing surfaces while stepping over obstacles, multiple reps with min guard/min A for balance. Cues for incr RLE foot clearance while clearing as pt has tendency to circumduct to clear. 2 reps down and back performing obstacles with cognitive challenge of asking pt to name animals starting from A-Z.  standing on blue compliant mat alternating stepping over and weight shifting over taller hurdle x10 reps, pt with incr difficulty with SLS on LLE, but improved with incr reps. Standing next to counter top: stepping on floor river stones of different heights for SLS with intermittent UE support, progressing to performing more slowly with pt needing min A at times for balance. Standing on blue foam: x12 reps B forward toe tap and then cross tap to floor  bubble with no UE support, first performed a couple reps B with single UE support of 2 cone taps and then floor bubble tap, but pt unable to perform without UE assist           Balance Exercises - 06/23/19 1026      Balance Exercises: Standing   Standing Eyes Closed  Narrow base of support (BOS);Foam/compliant surface;4 reps;20 secs;30 secs   20-30 seconds   Other Standing Exercises  on blue foam in corner with more narrow BOS: eyes closed 3 x 5 reps head nods, 3 x 5 reps head turns - during first 2 reps of head nods, pt needing min A for balance due to losing balance towards R        PT Education - 06/23/19 1020    Education Details  at end of session, pt asking about posterior stepping exercise that she can do at home as she has a foam pad, discussed performing at countertop  and making sure pt has non-slip pad under foam    Person(s) Educated  Patient    Methods  Explanation    Comprehension  Verbalized understanding       PT Short Term Goals - 06/20/19 1110      PT SHORT TERM GOAL #1   Title  Patient will be independent with HEP to foster lower extremity strength and balance to decrease fall risk and improve strength. (ALL STGs due 06/22/19)    Baseline  met for initial HEP on 06/16/19, provided pt with upgrades    Time  4    Period  Weeks    Status  Achieved    Target Date  06/22/19      PT SHORT TERM GOAL #2   Title  Patient will score >/= 19/30 on the FGA to indicate a decrease in risk for falls.    Baseline  MET 06/20/19 FGA = 19/30    Time  4    Period  Weeks    Status  Achieved      PT SHORT TERM GOAL #3   Title  Patient will demonstrate ability to ambulate >/= 500 feet including indoor and outdoor level surfaces with supervision to indicate a decrease in fall risk with community ambulation.    Baseline  MET 06/20/19    Time  4    Period  Weeks    Status  Achieved      PT SHORT TERM GOAL #4   Title  Patient will demonstrate ability to navigate 8 steps (4 steps, 2 sets) with supervision and unilateral railing to indicate a decrease in fall risk with stair negotiation.    Baseline  MET 06/20/19    Time  4    Period  Weeks    Status  Achieved      PT SHORT TERM GOAL #5   Title  Patient will demonstrate ability to maintain single limb stance on each lower extremity for >/= 5 seconds safety to indicate improvement in balance and decreae in fall risk.    Baseline  approx. 3-4 seconds B.    Time  4    Period  Weeks    Status  Not Met        PT Long Term Goals - 05/23/19 1235      PT LONG TERM GOAL #1   Title  Patient will be independent with HEP to foster lower extremity strength and balance to decrease fall risk and improve strength. (ALL LTGs DUE 07/22/19)  Time  8    Period  Weeks    Status  New    Target Date  07/22/19      PT  LONG TERM GOAL #2   Title  Patient will score >/= 24/30 on the FGA to demonstrate a decrease in fall risk.    Time  8    Period  Weeks    Status  New      PT LONG TERM GOAL #3   Title  Patient's gait velocity will be >/= 3.44 ft/sec with normalized gait pattern to demonstrate increased safety when ambulating in the community.    Time  8    Period  Weeks    Status  New      PT LONG TERM GOAL #4   Title  Patient will demonstrate ability to ambulate >/= 1000 feet including indoor, outdoor, level, unlevel surfaces to demonstrate a decrease in risk of falls when ambulating for exercise/leisure with friends.    Time  8    Period  Weeks    Status  New      PT LONG TERM GOAL #5   Title  Patient will demonstrate ability to climb and descend 12 steps (full flight of stairs - 4 steps, 3 reps) with modified independence to demonstrate a decrease in fall risk.    Time  8    Period  Weeks    Status  New      Additional Long Term Goals   Additional Long Term Goals  Yes      PT LONG TERM GOAL #6   Title  Patient will demonstrate ability to maintain single limb stance on each lower extremity for >/= 10 seconds safety to indicate improvement in balance and decreae in fall risk.    Time  8    Period  Weeks    Status  New            Plan - 06/23/19 1025    Clinical Impression Statement  10th visit progress note: Goals assessed at last session with pt achieving STGs #1-4. Pt improved FGA score to a 19/30 (previously was a 14/30), putting pt at a decreased fall risk. Pt able to navigate outdoor level surfaces with supervision during gait. Pt continues to have difficulty maintaining SLS B for greater than 5 seconds. Today's skilled session focused on SLS balance on compliant surfaces and obstacle negotiation and corner balance with eyes closed for vestibular input for balance. Pt more challenged when performing SLS on compliant surfaces, especially when clearing hurdles with RLE. Needed min  guard/min A at times during session for balance. Pt remains very motivated - will continue to progress towards LTGs.    Personal Factors and Comorbidities  Age;Comorbidity 1    Comorbidities  heart murmur, B TKAs, spinal stenosis    Examination-Activity Limitations  Bend;Lift;Squat;Locomotion Level;Stairs;Reach Overhead;Carry;Stand    Examination-Participation Restrictions  Cleaning;Laundry;Shop;Driving;Meal Prep;Community Activity    Stability/Clinical Decision Making  Evolving/Moderate complexity    Rehab Potential  Excellent    PT Frequency  2x / week    PT Duration  8 weeks    PT Treatment/Interventions  ADLs/Self Care Home Management;Aquatic Therapy;Moist Heat;Electrical Stimulation;Cryotherapy;Ultrasound;DME Instruction;Gait training;Therapeutic exercise;Therapeutic activities;Functional mobility training;Stair training;Balance training;Neuromuscular re-education;Patient/family education;Manual techniques;Orthotic Fit/Training;Passive range of motion;Energy conservation    PT Next Visit Plan  working towards single limb stance tolerance/maintenance, corner balance, posterior stepping strategies and balance with head motions, single limb stance, challenge vestibular system reliance for balance maintenance, tandem gait, high level  balance, gait with changes in direction/sudden starts and stops    PT Home Exercise Plan  [id number]    Consulted and Agree with Plan of Care  Patient       Patient will benefit from skilled therapeutic intervention in order to improve the following deficits and impairments:  Abnormal gait, Decreased balance, Decreased endurance, Decreased mobility, Difficulty walking, Decreased knowledge of precautions, Decreased range of motion, Impaired perceived functional ability, Improper body mechanics, Decreased activity tolerance, Decreased knowledge of use of DME, Decreased safety awareness, Decreased strength, Impaired flexibility, Postural dysfunction  Visit  Diagnosis: Muscle weakness (generalized)  Unsteadiness on feet  Other abnormalities of gait and mobility  Difficulty in walking, not elsewhere classified     Problem List Patient Active Problem List   Diagnosis Date Noted  . Asthma, chronic 04/18/2013  . HTN (hypertension) 04/18/2013    Arliss Journey, PT, DPT  06/23/2019, 10:31 AM  [facility] [address] [place], [place], [address] Phone: [phone]   Fax:  [phone]  Name: TENNILE STYLES MRN: [id number] Date of Birth: [birth year]

## 2019-06-27 ENCOUNTER — Other Ambulatory Visit: Payer: Self-pay

## 2019-06-27 ENCOUNTER — Other Ambulatory Visit: Payer: Self-pay | Admitting: Internal Medicine

## 2019-06-27 ENCOUNTER — Ambulatory Visit: Payer: Medicare PPO | Admitting: Rehabilitative and Restorative Service Providers"

## 2019-06-27 ENCOUNTER — Encounter: Payer: Self-pay | Admitting: Rehabilitative and Restorative Service Providers"

## 2019-06-27 DIAGNOSIS — Z1231 Encounter for screening mammogram for malignant neoplasm of breast: Secondary | ICD-10-CM

## 2019-06-27 DIAGNOSIS — R262 Difficulty in walking, not elsewhere classified: Secondary | ICD-10-CM

## 2019-06-27 DIAGNOSIS — M6281 Muscle weakness (generalized): Secondary | ICD-10-CM

## 2019-06-27 DIAGNOSIS — R2689 Other abnormalities of gait and mobility: Secondary | ICD-10-CM

## 2019-06-27 DIAGNOSIS — Z9181 History of falling: Secondary | ICD-10-CM

## 2019-06-27 DIAGNOSIS — R2681 Unsteadiness on feet: Secondary | ICD-10-CM

## 2019-06-27 NOTE — Therapy (Signed)
[facility] [address] [place], [place], [address] Phone: [phone]   Fax:  [phone]  Physical Therapy Treatment  Patient Details  Name: [name] MRN: [id number] Date of Birth: 12-12-1945 Referring Provider (PT): Elizabeth Utah   Encounter Date: 06/27/2019  PT End of Session - 06/27/19 1156    Visit Number  11    Number of Visits  17    Date for PT Re-Evaluation  07/22/19    Authorization Type  [employer]   10th visit progress note   PT Start Time  1106    PT Stop Time  1149    PT Time Calculation (min)  43 min    Equipment Utilized During Treatment  Gait belt    Activity Tolerance  Patient tolerated treatment well    Behavior During Therapy  [facility] for tasks assessed/performed       Past Medical History:  Diagnosis Date  . Allergy    Hay fever/bee stings  . Anxiety   . Arthritis   . Asthma   . Blood transfusion without reported diagnosis 2012  . Cataract   . Depression   . GERD (gastroesophageal reflux disease)   . Heart murmur   . Hypertension     Past Surgical History:  Procedure Laterality Date  . ABDOMINAL HYSTERECTOMY  1986  . MELANOMA EXCISION  2009   left shoulder near neck  . REPLACEMENT TOTAL KNEE BILATERAL  05/2010 &05/2010    There were no vitals filed for this visit.  Subjective Assessment - 06/27/19 1105    Subjective  Denies and falls or near falls. Reports daily compliance with HEP.    Pertinent History  hx significant for falls, heart murmur, s/p B TKAs (most recent in 2012), spinal stenosis    Limitations  Standing;Walking;Lifting;House hold activities    How long can you walk comfortably?  2-3 miles - I walk this with a friend 2x/wk    Patient Stated Goals  I want to get my balance back; I want to be steadier on my feet and not worry about falling down    Currently in Pain?  No/denies                        [facility] Adult PT Treatment/Exercise  - 06/27/19 1108      Ambulation/Gait   Ambulation/Gait  Yes    Ambulation/Gait Assistance  5: Supervision;4: Min guard    Ambulation/Gait Assistance Details  S when ambulating on level, indoor surfaces. S - min guard when on outdoor (grassy) surfaces especially when working on 90 degree turns (see gait coments, below)    Ambulation Distance (Feet)  414 Feet   414 x 1, 115 x 3, multiple short distances around clinic   Assistive device  None    Gait Pattern  Decreased arm swing - right;Decreased arm swing - left;Decreased step length - right;Decreased step length - left;Decreased dorsiflexion - right;Trendelenburg;Poor foot clearance - right    Ambulation Surface  Level;Unlevel;Indoor;Outdoor;Paved;Grass    Gait Comments  Initiated gait training for 90 degree turns at pace indoors in // bars, progressed to open environments indoors, then progressed to level, grassy surfaces. Requires multiple demos for proper technique and foot placement to maximize steadiness with weight shift when taking first step "through" after turning. Indoor able to perform with S; on grass requires close S with intermittent min A - also likely impacted by fatigue (as this was performed  at end of session)       Neuro Re-ed    Neuro Re-ed Details   Corner balance: feet together + EC + 30 second holds, 3 reps; narrow BOS (small gap between feet) + EO + head turns/nods x 30 seconds, 2 reps each direction - improvement with reps but initially heavy reliance/usage on hip strategies and intermittent hip/hand wall/chair taps. On incline: toes both up and down on incline, EC, x 30 second hold, 2 reps with toes in each direction - demo improvement with second rep but close S with intermittent tactile cueing required for safety with initial rep                PT Short Term Goals - 06/20/19 1110      PT SHORT TERM GOAL #1   Title  Patient will be independent with HEP to foster lower extremity strength and balance to decrease  fall risk and improve strength. (ALL STGs due 06/22/19)    Baseline  met for initial HEP on 06/16/19, provided pt with upgrades    Time  4    Period  Weeks    Status  Achieved    Target Date  06/22/19      PT SHORT TERM GOAL #2   Title  Patient will score >/= 19/30 on the FGA to indicate a decrease in risk for falls.    Baseline  MET 06/20/19 FGA = 19/30    Time  4    Period  Weeks    Status  Achieved      PT SHORT TERM GOAL #3   Title  Patient will demonstrate ability to ambulate >/= 500 feet including indoor and outdoor level surfaces with supervision to indicate a decrease in fall risk with community ambulation.    Baseline  MET 06/20/19    Time  4    Period  Weeks    Status  Achieved      PT SHORT TERM GOAL #4   Title  Patient will demonstrate ability to navigate 8 steps (4 steps, 2 sets) with supervision and unilateral railing to indicate a decrease in fall risk with stair negotiation.    Baseline  MET 06/20/19    Time  4    Period  Weeks    Status  Achieved      PT SHORT TERM GOAL #5   Title  Patient will demonstrate ability to maintain single limb stance on each lower extremity for >/= 5 seconds safety to indicate improvement in balance and decreae in fall risk.    Baseline  approx. 3-4 seconds B.    Time  4    Period  Weeks    Status  Not Met        PT Long Term Goals - 05/23/19 1235      PT LONG TERM GOAL #1   Title  Patient will be independent with HEP to foster lower extremity strength and balance to decrease fall risk and improve strength. (ALL LTGs DUE 07/22/19)    Time  8    Period  Weeks    Status  New    Target Date  07/22/19      PT LONG TERM GOAL #2   Title  Patient will score >/= 24/30 on the FGA to demonstrate a decrease in fall risk.    Time  8    Period  Weeks    Status  New      PT LONG TERM GOAL #  3   Title  Patient's gait velocity will be >/= 3.44 ft/sec with normalized gait pattern to demonstrate increased safety when ambulating in the  community.    Time  8    Period  Weeks    Status  New      PT LONG TERM GOAL #4   Title  Patient will demonstrate ability to ambulate >/= 1000 feet including indoor, outdoor, level, unlevel surfaces to demonstrate a decrease in risk of falls when ambulating for exercise/leisure with friends.    Time  8    Period  Weeks    Status  New      PT LONG TERM GOAL #5   Title  Patient will demonstrate ability to climb and descend 12 steps (full flight of stairs - 4 steps, 3 reps) with modified independence to demonstrate a decrease in fall risk.    Time  8    Period  Weeks    Status  New      Additional Long Term Goals   Additional Long Term Goals  Yes      PT LONG TERM GOAL #6   Title  Patient will demonstrate ability to maintain single limb stance on each lower extremity for >/= 10 seconds safety to indicate improvement in balance and decreae in fall risk.    Time  8    Period  Weeks    Status  New            Plan - 06/27/19 1157    Clinical Impression Statement  Today's skilled session focused on progression of higher level balance challenges especially fostering safety and improved techinique with 90 degree turns and progression of balance challenge fostering improvement in utilization of vestibular input. She is demonstrating improvement, but continues to be challenged when placed in situations where her vestibular system is her primary source of balance input. Will benefit from continued skilled PT progressing towards LTGs and to decrease fall risk.    Personal Factors and Comorbidities  Age;Comorbidity 1    Comorbidities  heart murmur, B TKAs, spinal stenosis    Examination-Activity Limitations  Bend;Lift;Squat;Locomotion Level;Stairs;Reach Overhead;Carry;Stand    Examination-Participation Restrictions  Cleaning;Laundry;Shop;Driving;Meal Prep;Community Activity    Stability/Clinical Decision Making  Evolving/Moderate complexity    Rehab Potential  Excellent    PT Frequency  2x  / week    PT Duration  8 weeks    PT Treatment/Interventions  ADLs/Self Care Home Management;Aquatic Therapy;Moist Heat;Electrical Stimulation;Cryotherapy;Ultrasound;DME Instruction;Gait training;Therapeutic exercise;Therapeutic activities;Functional mobility training;Stair training;Balance training;Neuromuscular re-education;Patient/family education;Manual techniques;Orthotic Fit/Training;Passive range of motion;Energy conservation    PT Next Visit Plan  working towards single limb stance tolerance/maintenance, corner balance, posterior stepping strategies and balance with head motions, single limb stance, challenge vestibular system reliance for balance maintenance, tandem gait, high level balance, gait with changes in direction/sudden starts and stops    PT Home Exercise Plan  [id number]    Consulted and Agree with Plan of Care  Patient       Patient will benefit from skilled therapeutic intervention in order to improve the following deficits and impairments:  Abnormal gait, Decreased balance, Decreased endurance, Decreased mobility, Difficulty walking, Decreased knowledge of precautions, Decreased range of motion, Impaired perceived functional ability, Improper body mechanics, Decreased activity tolerance, Decreased knowledge of use of DME, Decreased safety awareness, Decreased strength, Impaired flexibility, Postural dysfunction  Visit Diagnosis: Muscle weakness (generalized)  Unsteadiness on feet  Other abnormalities of gait and mobility  Difficulty in walking, not elsewhere classified  History of  falling     Problem List Patient Active Problem List   Diagnosis Date Noted  . Asthma, chronic 04/18/2013  . HTN (hypertension) 04/18/2013    Waller, PT, DPT  06/27/2019, 12:03 PM  [facility] [address] [place], [place], [address] Phone: [phone]   Fax:  [phone]  Name: MINAAL STRUCKMAN MRN:  [id number] Date of Birth: 07/26/1945

## 2019-07-01 ENCOUNTER — Ambulatory Visit: Payer: Medicare PPO | Attending: Internal Medicine | Admitting: Physical Therapy

## 2019-07-01 ENCOUNTER — Other Ambulatory Visit: Payer: Self-pay

## 2019-07-01 DIAGNOSIS — M6281 Muscle weakness (generalized): Secondary | ICD-10-CM | POA: Diagnosis present

## 2019-07-01 DIAGNOSIS — R2689 Other abnormalities of gait and mobility: Secondary | ICD-10-CM | POA: Diagnosis present

## 2019-07-01 DIAGNOSIS — R2681 Unsteadiness on feet: Secondary | ICD-10-CM | POA: Diagnosis not present

## 2019-07-01 DIAGNOSIS — R262 Difficulty in walking, not elsewhere classified: Secondary | ICD-10-CM | POA: Diagnosis present

## 2019-07-01 DIAGNOSIS — Z9181 History of falling: Secondary | ICD-10-CM | POA: Insufficient documentation

## 2019-07-01 NOTE — Therapy (Signed)
[facility] [address] [place], [place], [address] Phone: [phone]   Fax:  [phone]  Physical Therapy Treatment  Patient Details  Name: [name] MRN: [id number] Date of Birth: [birth year] Referring Provider (PT): [place]   Encounter Date: 07/01/2019  PT End of Session - 07/01/19 1144    Visit Number  12    Number of Visits  17    Date for PT Re-Evaluation  07/22/19    Authorization Type  [employer]   10th visit progress note   PT Start Time  1108   pt arrived late   PT Stop Time  1144    PT Time Calculation (min)  36 min    Equipment Utilized During Treatment  Gait belt    Activity Tolerance  Patient tolerated treatment well    Behavior During Therapy  WFL for tasks assessed/performed       Past Medical History:  Diagnosis Date  . Allergy    Hay fever/bee stings  . Anxiety   . Arthritis   . Asthma   . Blood transfusion without reported diagnosis 2012  . Cataract   . Depression   . GERD (gastroesophageal reflux disease)   . Heart murmur   . Hypertension     Past Surgical History:  Procedure Laterality Date  . ABDOMINAL HYSTERECTOMY  1986  . MELANOMA EXCISION  2009   left shoulder near neck  . REPLACEMENT TOTAL KNEE BILATERAL  05/2010 &05/2010    There were no vitals filed for this visit.  Subjective Assessment - 07/01/19 1110    Subjective  did a lot of walking over the weekend. Did some walking outdoors, daughter was right there with her.    Pertinent History  hx significant for falls, heart murmur, s/p B TKAs (most recent in 2012), spinal stenosis    Limitations  Standing;Walking;Lifting;House hold activities    How long can you walk comfortably?  2-3 miles - I walk this with a friend 2x/wk    Patient Stated Goals  I want to get my balance back; I want to be steadier on my feet and not worry about falling down    Currently in Pain?  No/denies                         [facility] Adult PT Treatment/Exercise - 07/01/19 1123      Ambulation/Gait   Ambulation/Gait  Yes    Ambulation/Gait Assistance  5: Supervision;4: Min guard    Ambulation/Gait Assistance Details  on grass performed turns with pt utilizing the clock method to perform in 360 degrees with cues for foot clearance and leading with outside leg to initiate turns (pt at times would lead with inside leg and lead to balance instability). Performed direction changes outdoors with pt performing 90 and 180 degree turns, with a couple instances of min guard for balance. Also practiced incr gait speed, sudden starts/stops, retro gait and side stepping in different directions    Ambulation Distance (Feet)  500 Feet    Assistive device  None    Gait Pattern  Decreased arm swing - right;Decreased arm swing - left;Decreased step length - right;Decreased step length - left;Decreased dorsiflexion - right;Trendelenburg;Poor foot clearance - right    Ambulation Surface  Unlevel;Outdoor;Grass          Balance Exercises - 07/01/19 1132      Balance Exercises: Standing   Stepping Strategy  Anterior;Posterior;Foam/compliant  surface    Stepping Strategy Limitations  on blue foam beam, x10 reps each direction B, beginning with single UE support on counter, fading to no UE support, need of min/mod A at times due to pt almost losing balance posteriorly    Rockerboard  Anterior/posterior;EO;Limitations    Rockerboard Limitations  x15 reps A/P weight shifting, cues for hip strategy, pt with incr difficulty with using hip strategy in anterior direction    Other Standing Exercises  on rockerboard with eyes open in A/P direction: 2 x 10 reps vertical head nods with min guard for balance, x10 reps R/L head turns    Other Standing Exercises Comments  on blue foam beam: with single UE support alternating heel taps to floor x10 reps B           PT Short Term Goals - 06/20/19 1110      PT  SHORT TERM GOAL #1   Title  Patient will be independent with HEP to foster lower extremity strength and balance to decrease fall risk and improve strength. (ALL STGs due 06/22/19)    Baseline  met for initial HEP on 06/16/19, provided pt with upgrades    Time  4    Period  Weeks    Status  Achieved    Target Date  06/22/19      PT SHORT TERM GOAL #2   Title  Patient will score >/= 19/30 on the FGA to indicate a decrease in risk for falls.    Baseline  MET 06/20/19 FGA = 19/30    Time  4    Period  Weeks    Status  Achieved      PT SHORT TERM GOAL #3   Title  Patient will demonstrate ability to ambulate >/= 500 feet including indoor and outdoor level surfaces with supervision to indicate a decrease in fall risk with community ambulation.    Baseline  MET 06/20/19    Time  4    Period  Weeks    Status  Achieved      PT SHORT TERM GOAL #4   Title  Patient will demonstrate ability to navigate 8 steps (4 steps, 2 sets) with supervision and unilateral railing to indicate a decrease in fall risk with stair negotiation.    Baseline  MET 06/20/19    Time  4    Period  Weeks    Status  Achieved      PT SHORT TERM GOAL #5   Title  Patient will demonstrate ability to maintain single limb stance on each lower extremity for >/= 5 seconds safety to indicate improvement in balance and decreae in fall risk.    Baseline  approx. 3-4 seconds B.    Time  4    Period  Weeks    Status  Not Met        PT Long Term Goals - 05/23/19 1235      PT LONG TERM GOAL #1   Title  Patient will be independent with HEP to foster lower extremity strength and balance to decrease fall risk and improve strength. (ALL LTGs DUE 07/22/19)    Time  8    Period  Weeks    Status  New    Target Date  07/22/19      PT LONG TERM GOAL #2   Title  Patient will score >/= 24/30 on the FGA to demonstrate a decrease in fall risk.    Time  8  Period  Weeks    Status  New      PT LONG TERM GOAL #3   Title  Patient's gait  velocity will be >/= 3.44 ft/sec with normalized gait pattern to demonstrate increased safety when ambulating in the community.    Time  8    Period  Weeks    Status  New      PT LONG TERM GOAL #4   Title  Patient will demonstrate ability to ambulate >/= 1000 feet including indoor, outdoor, level, unlevel surfaces to demonstrate a decrease in risk of falls when ambulating for exercise/leisure with friends.    Time  8    Period  Weeks    Status  New      PT LONG TERM GOAL #5   Title  Patient will demonstrate ability to climb and descend 12 steps (full flight of stairs - 4 steps, 3 reps) with modified independence to demonstrate a decrease in fall risk.    Time  8    Period  Weeks    Status  New      Additional Long Term Goals   Additional Long Term Goals  Yes      PT LONG TERM GOAL #6   Title  Patient will demonstrate ability to maintain single limb stance on each lower extremity for >/= 10 seconds safety to indicate improvement in balance and decreae in fall risk.    Time  8    Period  Weeks    Status  New            Plan - 07/01/19 1251    Clinical Impression Statement  Today's skilled session continued to build upon practicing 90 and 180 degree turns, especially on unlevel surfaces outdoors. Pt needing cues to perform turns with leading with her outside leg in order to improve BOS. When on rockerboard, pt utilizes posterior hip strategy to help maintain balance, esp with performing head nods. At end of session, pt needing min/mod A for stepping strategy balance on compliant surfaces. Pt remains very motivated, will continue to progress towards LTGs.    Personal Factors and Comorbidities  Age;Comorbidity 1    Comorbidities  heart murmur, B TKAs, spinal stenosis    Examination-Activity Limitations  Bend;Lift;Squat;Locomotion Level;Stairs;Reach Overhead;Carry;Stand    Examination-Participation Restrictions  Cleaning;Laundry;Shop;Driving;Meal Prep;Community Activity     Stability/Clinical Decision Making  Evolving/Moderate complexity    Rehab Potential  Excellent    PT Frequency  2x / week    PT Duration  8 weeks    PT Treatment/Interventions  ADLs/Self Care Home Management;Aquatic Therapy;Moist Heat;Electrical Stimulation;Cryotherapy;Ultrasound;DME Instruction;Gait training;Therapeutic exercise;Therapeutic activities;Functional mobility training;Stair training;Balance training;Neuromuscular re-education;Patient/family education;Manual techniques;Orthotic Fit/Training;Passive range of motion;Energy conservation    PT Next Visit Plan  stair training. working towards single limb stance tolerance/maintenance, corner balance, posterior stepping strategies and balance with head motions, gait with changes in direction/sudden starts and stops    PT Home Exercise Plan  [id number]    Consulted and Agree with Plan of Care  Patient       Patient will benefit from skilled therapeutic intervention in order to improve the following deficits and impairments:  Abnormal gait, Decreased balance, Decreased endurance, Decreased mobility, Difficulty walking, Decreased knowledge of precautions, Decreased range of motion, Impaired perceived functional ability, Improper body mechanics, Decreased activity tolerance, Decreased knowledge of use of DME, Decreased safety awareness, Decreased strength, Impaired flexibility, Postural dysfunction  Visit Diagnosis: Unsteadiness on feet  Muscle weakness (generalized)  Other abnormalities of gait and  mobility  Difficulty in walking, not elsewhere classified     Problem List Patient Active Problem List   Diagnosis Date Noted  . Asthma, chronic 04/18/2013  . HTN (hypertension) 04/18/2013    Arliss Journey, PT, DPT  07/01/2019, 12:54 PM  [facility] [address] [place], [place], [address] Phone: [phone]   Fax:  [phone]  Name: [name] MRN:  [id number] Date of Birth: [birth year]

## 2019-07-04 ENCOUNTER — Ambulatory Visit: Payer: Medicare PPO | Admitting: Rehabilitative and Restorative Service Providers"

## 2019-07-04 ENCOUNTER — Encounter: Payer: Self-pay | Admitting: Rehabilitative and Restorative Service Providers"

## 2019-07-04 ENCOUNTER — Other Ambulatory Visit: Payer: Self-pay

## 2019-07-04 DIAGNOSIS — Z9181 History of falling: Secondary | ICD-10-CM

## 2019-07-04 DIAGNOSIS — R262 Difficulty in walking, not elsewhere classified: Secondary | ICD-10-CM

## 2019-07-04 DIAGNOSIS — R2689 Other abnormalities of gait and mobility: Secondary | ICD-10-CM

## 2019-07-04 DIAGNOSIS — R2681 Unsteadiness on feet: Secondary | ICD-10-CM

## 2019-07-04 DIAGNOSIS — M6281 Muscle weakness (generalized): Secondary | ICD-10-CM

## 2019-07-04 NOTE — Therapy (Signed)
[facility] [address] [place], [place], [address] Phone: [phone]   Fax:  [phone]  Physical Therapy Treatment  Patient Details  Name: [name] MRN: [id number] Date of Birth: July 04, 1945 Referring Provider (PT): [place]   Encounter Date: 07/04/2019  PT End of Session - 07/04/19 1244    Visit Number  13    Number of Visits  17    Date for PT Re-Evaluation  07/22/19    Authorization Type  [employer]   10th visit progress note   PT Start Time  1104    PT Stop Time  1142    PT Time Calculation (min)  38 min    Equipment Utilized During Treatment  Gait belt    Activity Tolerance  Patient tolerated treatment well    Behavior During Therapy  WFL for tasks assessed/performed       Past Medical History:  Diagnosis Date  . Allergy    Hay fever/bee stings  . Anxiety   . Arthritis   . Asthma   . Blood transfusion without reported diagnosis 2012  . Cataract   . Depression   . GERD (gastroesophageal reflux disease)   . Heart murmur   . Hypertension     Past Surgical History:  Procedure Laterality Date  . ABDOMINAL HYSTERECTOMY  1986  . MELANOMA EXCISION  2009   left shoulder near neck  . REPLACEMENT TOTAL KNEE BILATERAL  05/2010 &05/2010    There were no vitals filed for this visit.  Subjective Assessment - 07/04/19 1104    Subjective  Denies any falls or near falls since last visit. Compliance with HEP. Walking down the stairs is giving me trouble, but turning is getting better.    Pertinent History  hx significant for falls, heart murmur, s/p B TKAs (most recent in 2012), spinal stenosis    Limitations  Standing;Walking;Lifting;House hold activities    How long can you walk comfortably?  2-3 miles - I walk this with a friend 2x/wk    Patient Stated Goals  I want to get my balance back; I want to be steadier on my feet and not worry about falling down    Currently in Pain?   No/denies                        [facility] Adult PT Treatment/Exercise - 07/04/19 1106      Ambulation/Gait   Ambulation/Gait  Yes    Ambulation/Gait Assistance  5: Supervision;4: Min guard    Ambulation/Gait Assistance Details  Gait with head turns, nods, 90 degree turns (opening feet up - proper placement for first step, stepping through with second step and anterior weight shift over "stepping through" foot to minimize posterior LOB) including outdoors gait - great improvement in turns with repetition shifting from CGA initially to supervision even on outdoor surfaces (level, grass)    Ambulation Distance (Feet)  500 Feet   50 x 1, 414 x 1   Assistive device  None    Gait Pattern  Decreased arm swing - right;Decreased arm swing - left;Decreased step length - right;Decreased step length - left;Decreased dorsiflexion - right;Trendelenburg;Poor foot clearance - right    Ambulation Surface  Level;Unlevel;Indoor;Outdoor;Paved;Grass    Stairs  Yes    Stairs Assistance  5: Supervision;4: Min guard    Stairs Assistance Details (indicate cue type and reason)  L railing only to simulate daughter's home and church (can reach  both, but is a far reach) - reciprocal gait when ascending and descending but requires cueing to keep hips facing forward with descent and to continue a step through at last step to minimize strong posterior lean/near LOB once at base of steps. Marked improvement with practice especially with the base of the steps/last step x 8 reps of 4 steps with instruction provided between each set     Stair Management Technique  No rails;One rail Left;Alternating pattern;Step to pattern;Sideways;Forwards   initially sideways, cued for fwd   Number of Stairs  4   x 8    Height of Stairs  6      Neuro Re-ed    Neuro Re-ed Details   At counter (B hands hovering with multiple taps throughout activity) - alt foot taps (lateral, posterior, central, and repeat) x 10, 2 sets with CGA  and intermittent min A required to prevent LOB especially in posterior direction. Progressed to blue floormat + taps on cones x 10 bilaterally. On floor pillow + fwd foot taps on raised floor dots (B hands hovered over counter & chair back) with PT CGA up to mod A on second set due to increase sway and fatigue. Fwd marching/backwards walking on blue floor mat at counter with light unilateral UE support + CGA for safety x 8 laps with qualitative improvement in steadiness with repetition with cueing to slow and control motion                  PT Short Term Goals - 06/20/19 1110      PT SHORT TERM GOAL #1   Title  Patient will be independent with HEP to foster lower extremity strength and balance to decrease fall risk and improve strength. (ALL STGs due 06/22/19)    Baseline  met for initial HEP on 06/16/19, provided pt with upgrades    Time  4    Period  Weeks    Status  Achieved    Target Date  06/22/19      PT SHORT TERM GOAL #2   Title  Patient will score >/= 19/30 on the FGA to indicate a decrease in risk for falls.    Baseline  MET 06/20/19 FGA = 19/30    Time  4    Period  Weeks    Status  Achieved      PT SHORT TERM GOAL #3   Title  Patient will demonstrate ability to ambulate >/= 500 feet including indoor and outdoor level surfaces with supervision to indicate a decrease in fall risk with community ambulation.    Baseline  MET 06/20/19    Time  4    Period  Weeks    Status  Achieved      PT SHORT TERM GOAL #4   Title  Patient will demonstrate ability to navigate 8 steps (4 steps, 2 sets) with supervision and unilateral railing to indicate a decrease in fall risk with stair negotiation.    Baseline  MET 06/20/19    Time  4    Period  Weeks    Status  Achieved      PT SHORT TERM GOAL #5   Title  Patient will demonstrate ability to maintain single limb stance on each lower extremity for >/= 5 seconds safety to indicate improvement in balance and decreae in fall risk.     Baseline  approx. 3-4 seconds B.    Time  4    Period  Weeks  Status  Not Met        PT Long Term Goals - 05/23/19 1235      PT LONG TERM GOAL #1   Title  Patient will be independent with HEP to foster lower extremity strength and balance to decrease fall risk and improve strength. (ALL LTGs DUE 07/22/19)    Time  8    Period  Weeks    Status  New    Target Date  07/22/19      PT LONG TERM GOAL #2   Title  Patient will score >/= 24/30 on the FGA to demonstrate a decrease in fall risk.    Time  8    Period  Weeks    Status  New      PT LONG TERM GOAL #3   Title  Patient's gait velocity will be >/= 3.44 ft/sec with normalized gait pattern to demonstrate increased safety when ambulating in the community.    Time  8    Period  Weeks    Status  New      PT LONG TERM GOAL #4   Title  Patient will demonstrate ability to ambulate >/= 1000 feet including indoor, outdoor, level, unlevel surfaces to demonstrate a decrease in risk of falls when ambulating for exercise/leisure with friends.    Time  8    Period  Weeks    Status  New      PT LONG TERM GOAL #5   Title  Patient will demonstrate ability to climb and descend 12 steps (full flight of stairs - 4 steps, 3 reps) with modified independence to demonstrate a decrease in fall risk.    Time  8    Period  Weeks    Status  New      Additional Long Term Goals   Additional Long Term Goals  Yes      PT LONG TERM GOAL #6   Title  Patient will demonstrate ability to maintain single limb stance on each lower extremity for >/= 10 seconds safety to indicate improvement in balance and decreae in fall risk.    Time  8    Period  Weeks    Status  New            Plan - 07/04/19 1245    Clinical Impression Statement  Today's skilled session focused on progression of gait training with multiple added balance and/or dual tasking challenges with continued strong emphasis on turns especially when on compliant surfaces, which she  demonstrated marked improvement from session one week ago (when introduced). Her balance strategies and weight shift (especially anterior weight shift to maintain/foster forward momentum preventing posterior LOB) is greatly improving. Continues to demonstrate strong posterior lean with several near LOB (were it not for PT's assistance) especially with single limb stance. She is making great progress and will benefit from continued skilled PT working towards [place].    Personal Factors and Comorbidities  Age;Comorbidity 1    Comorbidities  heart murmur, B TKAs, spinal stenosis    Examination-Activity Limitations  Bend;Lift;Squat;Locomotion Level;Stairs;Reach Overhead;Carry;Stand    Examination-Participation Restrictions  Cleaning;Laundry;Shop;Driving;Meal Prep;Community Activity    Stability/Clinical Decision Making  Evolving/Moderate complexity    Rehab Potential  Excellent    PT Frequency  2x / week    PT Duration  8 weeks    PT Treatment/Interventions  ADLs/Self Care Home Management;Aquatic Therapy;Moist Heat;Electrical Stimulation;Cryotherapy;Ultrasound;DME Instruction;Gait training;Therapeutic exercise;Therapeutic activities;Functional mobility training;Stair training;Balance training;Neuromuscular re-education;Patient/family education;Manual techniques;Orthotic Fit/Training;Passive range of motion;Energy conservation  PT Next Visit Plan  stair training. working towards single limb stance tolerance/maintenance, corner balance, posterior stepping strategies and balance with head motions, gait with changes in direction/sudden starts and stops - review on curbs, obstacle negotiation, dual tasking    PT Home Exercise Plan  [place] and Agree with Plan of Care  Patient       Patient will benefit from skilled therapeutic intervention in order to improve the following deficits and impairments:  Abnormal gait, Decreased balance, Decreased endurance, Decreased mobility, Difficulty walking,  Decreased knowledge of precautions, Decreased range of motion, Impaired perceived functional ability, Improper body mechanics, Decreased activity tolerance, Decreased knowledge of use of DME, Decreased safety awareness, Decreased strength, Impaired flexibility, Postural dysfunction  Visit Diagnosis: Unsteadiness on feet  Muscle weakness (generalized)  Other abnormalities of gait and mobility  Difficulty in walking, not elsewhere classified  History of falling     Problem List Patient Active Problem List   Diagnosis Date Noted  . Asthma, chronic 04/18/2013  . HTN (hypertension) 04/18/2013    [place], PT, DPT  07/04/2019, 12:50 PM  [facility] [address] [place], [place], [address] Phone: [phone]   Fax:  [phone]  Name: [name] MRN: [id number] Date of Birth: 05/16/1945

## 2019-07-07 ENCOUNTER — Ambulatory Visit: Payer: Medicare PPO | Admitting: Physical Therapy

## 2019-07-07 ENCOUNTER — Encounter: Payer: Self-pay | Admitting: Physical Therapy

## 2019-07-07 ENCOUNTER — Other Ambulatory Visit: Payer: Self-pay

## 2019-07-07 DIAGNOSIS — M6281 Muscle weakness (generalized): Secondary | ICD-10-CM

## 2019-07-07 DIAGNOSIS — R2689 Other abnormalities of gait and mobility: Secondary | ICD-10-CM

## 2019-07-07 DIAGNOSIS — R2681 Unsteadiness on feet: Secondary | ICD-10-CM | POA: Diagnosis not present

## 2019-07-07 DIAGNOSIS — R262 Difficulty in walking, not elsewhere classified: Secondary | ICD-10-CM

## 2019-07-07 NOTE — Therapy (Signed)
[facility] [address] [place], [place], [address] Phone: [phone]   Fax:  [phone]  Physical Therapy Treatment  Patient Details  Name: [name] MRN: [id number] Date of Birth: Aug 06, 1945 Referring Provider (PT): [place]   Encounter Date: 07/07/2019  PT End of Session - 07/07/19 1154    Visit Number  14    Number of Visits  17    Date for PT Re-Evaluation  07/22/19    Authorization Type  [employer]   10th visit progress note   PT Start Time  1104    PT Stop Time  1144    PT Time Calculation (min)  40 min    Equipment Utilized During Treatment  Gait belt    Activity Tolerance  Patient tolerated treatment well    Behavior During Therapy  [facility] for tasks assessed/performed       Past Medical History:  Diagnosis Date  . Allergy    Hay fever/bee stings  . Anxiety   . Arthritis   . Asthma   . Blood transfusion without reported diagnosis 2012  . Cataract   . Depression   . GERD (gastroesophageal reflux disease)   . Heart murmur   . Hypertension     Past Surgical History:  Procedure Laterality Date  . ABDOMINAL HYSTERECTOMY  1986  . MELANOMA EXCISION  2009   left shoulder near neck  . REPLACEMENT TOTAL KNEE BILATERAL  05/2010 &05/2010    There were no vitals filed for this visit.  Subjective Assessment - 07/07/19 1106    Subjective  Had a near miss yesterday - was taking a step backwards and stepping off a solid surface onto the solid grass, but was able to keep her balance. 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 Has been practicing the turns and they have been getting easier.    Pertinent History  hx significant for falls, heart murmur, s/p B TKAs (most recent in 2012), spinal stenosis    Limitations  Standing;Walking;Lifting;House hold activities    How long can you walk comfortably?  2-3 miles - I walk this with a friend 2x/wk    Patient Stated Goals  I want to get my balance back;  I want to be steadier on my feet and not worry about falling down    Currently in Pain?  No/denies                        [facility] Adult PT Treatment/Exercise - 07/07/19 0001      Ambulation/Gait   Stairs  Yes    Stairs Assistance  5: Supervision;4: Min guard    Stairs Assistance Details (indicate cue type and reason)  min guard during 1st rep when pt descending last 2 steps for balance     Stair Management Technique  No rails;Alternating pattern    Number of Stairs  8    Height of Stairs  6    Gait Comments  resisted retro gait 5 x 20'  - cues for weight shift      High Level Balance   High Level Balance Comments With no UE support: step ups to first 6" step and tapping contralateral toe to 2nd step and then back down to the ground x5 reps B, pt initially taking incr time for push off to 2nd step          Balance Exercises - 07/07/19 0001  Balance Exercises: Standing   Stepping Strategy  Anterior;Posterior;Foam/compliant surface;10 reps    Stepping Strategy Limitations  on blue foam beam, x10 reps anterior with no UE support, 2 x 10 reps posterior with pt needing intermittent UE support and min guard/min A for balance    Other Standing Exercises  on incline with blue compliant surface: standing up incline x10 reps posterior stepping strategy, facing down incline x10 reps forward stepping strategy with min guard for balance, lateral stepping strategy down incline x10 reps B   Other Standing Exercises Comments  Standing on rockerboard in A/P direction: alternating cone taps x12 reps B, stepping onto rockerboard and then tapping cone anteriorly with single leg on rockerboard and stepping off board  x10 reps B . Min guard/min A for balance.          PT Short Term Goals - 06/20/19 1110      PT SHORT TERM GOAL #1   Title  Patient will be independent with HEP to foster lower extremity strength and balance to decrease fall risk and improve strength. (ALL STGs due  06/22/19)    Baseline  met for initial HEP on 06/16/19, provided pt with upgrades    Time  4    Period  Weeks    Status  Achieved    Target Date  06/22/19      PT SHORT TERM GOAL #2   Title  Patient will score >/= 19/30 on the FGA to indicate a decrease in risk for falls.    Baseline  MET 06/20/19 FGA = 19/30    Time  4    Period  Weeks    Status  Achieved      PT SHORT TERM GOAL #3   Title  Patient will demonstrate ability to ambulate >/= 500 feet including indoor and outdoor level surfaces with supervision to indicate a decrease in fall risk with community ambulation.    Baseline  MET 06/20/19    Time  4    Period  Weeks    Status  Achieved      PT SHORT TERM GOAL #4   Title  Patient will demonstrate ability to navigate 8 steps (4 steps, 2 sets) with supervision and unilateral railing to indicate a decrease in fall risk with stair negotiation.    Baseline  MET 06/20/19    Time  4    Period  Weeks    Status  Achieved      PT SHORT TERM GOAL #5   Title  Patient will demonstrate ability to maintain single limb stance on each lower extremity for >/= 5 seconds safety to indicate improvement in balance and decreae in fall risk.    Baseline  approx. 3-4 seconds B.    Time  4    Period  Weeks    Status  Not Met        PT Long Term Goals - 05/23/19 1235      PT LONG TERM GOAL #1   Title  Patient will be independent with HEP to foster lower extremity strength and balance to decrease fall risk and improve strength. (ALL LTGs DUE 07/22/19)    Time  8    Period  Weeks    Status  New    Target Date  07/22/19      PT LONG TERM GOAL #2   Title  Patient will score >/= 24/30 on the FGA to demonstrate a decrease in fall risk.    Time  8    Period  Weeks    Status  New      PT LONG TERM GOAL #3   Title  Patient's gait velocity will be >/= 3.44 ft/sec with normalized gait pattern to demonstrate increased safety when ambulating in the community.    Time  8    Period  Weeks    Status   New      PT LONG TERM GOAL #4   Title  Patient will demonstrate ability to ambulate >/= 1000 feet including indoor, outdoor, level, unlevel surfaces to demonstrate a decrease in risk of falls when ambulating for exercise/leisure with friends.    Time  8    Period  Weeks    Status  New      PT LONG TERM GOAL #5   Title  Patient will demonstrate ability to climb and descend 12 steps (full flight of stairs - 4 steps, 3 reps) with modified independence to demonstrate a decrease in fall risk.    Time  8    Period  Weeks    Status  New      Additional Long Term Goals   Additional Long Term Goals  Yes      PT LONG TERM GOAL #6   Title  Patient will demonstrate ability to maintain single limb stance on each lower extremity for >/= 10 seconds safety to indicate improvement in balance and decreae in fall risk.    Time  8    Period  Weeks    Status  New            Plan - 07/07/19 1155    Clinical Impression Statement  Today's skilled session focused on stepping strategies on compliant surfaces and SLS activities. Pt with incr difficulty with posterior stepping strategy initially, needing UE support or min A from therapist for balance, but improves with increased reps. Intermittent rest breaks taken throughout session due to fatigue. Pt remains very motivated and is making great progress, will continue to progress towards LTGs.    Personal Factors and Comorbidities  Age;Comorbidity 1    Comorbidities  heart murmur, B TKAs, spinal stenosis    Examination-Activity Limitations  Bend;Lift;Squat;Locomotion Level;Stairs;Reach Overhead;Carry;Stand    Examination-Participation Restrictions  Cleaning;Laundry;Shop;Driving;Meal Prep;Community Activity    Stability/Clinical Decision Making  Evolving/Moderate complexity    Rehab Potential  Excellent    PT Frequency  2x / week    PT Duration  8 weeks    PT Treatment/Interventions  ADLs/Self Care Home Management;Aquatic Therapy;Moist Heat;Electrical  Stimulation;Cryotherapy;Ultrasound;DME Instruction;Gait training;Therapeutic exercise;Therapeutic activities;Functional mobility training;Stair training;Balance training;Neuromuscular re-education;Patient/family education;Manual techniques;Orthotic Fit/Training;Passive range of motion;Energy conservation    PT Next Visit Plan  working towards single limb stance tolerance/maintenance, corner balance, posterior stepping strategies and balance with head motions, gait with changes in direction/sudden starts and stops - review on curbs, obstacle negotiation, dual tasking    PT Home Exercise Plan  [id number]    Consulted and Agree with Plan of Care  Patient       Patient will benefit from skilled therapeutic intervention in order to improve the following deficits and impairments:  Abnormal gait, Decreased balance, Decreased endurance, Decreased mobility, Difficulty walking, Decreased knowledge of precautions, Decreased range of motion, Impaired perceived functional ability, Improper body mechanics, Decreased activity tolerance, Decreased knowledge of use of DME, Decreased safety awareness, Decreased strength, Impaired flexibility, Postural dysfunction  Visit Diagnosis: Unsteadiness on feet  Muscle weakness (generalized)  Other abnormalities of gait and mobility  Difficulty in walking, not  elsewhere classified     Problem List Patient Active Problem List   Diagnosis Date Noted  . Asthma, chronic 04/18/2013  . HTN (hypertension) 04/18/2013    Arliss Journey, PT, DPT  07/07/2019, 12:00 PM  [facility] [address] [place], [place], [address] Phone: [phone]   Fax:  [phone]  Name: [name] MRN: [id number] Date of Birth: 08/08/1945

## 2019-07-11 ENCOUNTER — Encounter: Payer: Self-pay | Admitting: Rehabilitative and Restorative Service Providers"

## 2019-07-11 ENCOUNTER — Ambulatory Visit: Payer: Medicare PPO | Admitting: Rehabilitative and Restorative Service Providers"

## 2019-07-11 ENCOUNTER — Ambulatory Visit: Payer: Medicare Other | Admitting: Physician Assistant

## 2019-07-11 ENCOUNTER — Other Ambulatory Visit: Payer: Self-pay

## 2019-07-11 DIAGNOSIS — R2681 Unsteadiness on feet: Secondary | ICD-10-CM

## 2019-07-11 DIAGNOSIS — M6281 Muscle weakness (generalized): Secondary | ICD-10-CM

## 2019-07-11 DIAGNOSIS — R2689 Other abnormalities of gait and mobility: Secondary | ICD-10-CM

## 2019-07-11 NOTE — Therapy (Signed)
[facility] [address] [place], [place], [address] Phone: [phone]   Fax:  [phone]  Physical Therapy Treatment  Patient Details  Name: [name] MRN: [id number] Date of Birth: 16-Nov-1945 Referring Provider (PT): [place]   Encounter Date: 07/11/2019   PT End of Session - 07/11/19 1520    Visit Number 15    Number of Visits 17    Date for PT Re-Evaluation 07/22/19    Authorization Type [employer]   10th visit progress note   PT Start Time 9371    PT Stop Time 1358    PT Time Calculation (min) 43 min    Equipment Utilized During Treatment Gait belt    Activity Tolerance Patient tolerated treatment well    Behavior During Therapy [facility] for tasks assessed/performed           Past Medical History:  Diagnosis Date   Allergy    Hay fever/bee stings   Anxiety    Arthritis    Asthma    Blood transfusion without reported diagnosis 2012   Cataract    Depression    GERD (gastroesophageal reflux disease)    Heart murmur    Hypertension     Past Surgical History:  Procedure Laterality Date   [place]  2009   left shoulder near neck   REPLACEMENT TOTAL KNEE BILATERAL  05/2010 &05/2010    There were no vitals filed for this visit.   Subjective Assessment - 07/11/19 1313    Subjective Patient denies any falls or near falls since last visit. Reports that turns are getting better. HEP going well.    Pertinent History hx significant for falls, heart murmur, s/p B TKAs (most recent in 2012), spinal stenosis    Limitations Standing;Walking;Lifting;House hold activities    How long can you walk comfortably? 2-3 miles - I walk this with a friend 2x/wk    Patient Stated Goals I want to get my balance back; I want to be steadier on my feet and not worry about falling down    Currently in Pain? No/denies                              [facility] Adult PT Treatment/Exercise - 07/11/19 1319      Ambulation/Gait   Ambulation/Gait Yes    Ambulation/Gait Assistance 5: Supervision;4: Min guard    Ambulation/Gait Assistance Details requires intermittent min guard when in distracting environments (outdoors) and simultaneously dual tasksing due to default to drift to right side    Ambulation Distance (Feet) 300 Feet   and multiple distances in/around PT clinic   Assistive device None    Gait Pattern Decreased arm swing - right;Decreased arm swing - left;Decreased step length - right;Decreased step length - left;Decreased dorsiflexion - right;Trendelenburg;Poor foot clearance - right    Ambulation Surface Level;Unlevel;Indoor;Outdoor;Paved    Stairs Yes    Stairs Assistance 5: Supervision;4: Min guard    Stairs Assistance Details (indicate cue type and reason) min guard when descending stairs without UE support due to poor eccentric lowering control; close S was safe with unilateral light touch on railing     Stair Management Technique No rails;One rail Left;Alternating pattern;Forwards    Number of Stairs 16   4 x 4    Height of Stairs 6    Curb 5: Supervision   performed outside  Curb Details (indicate cue type and reason) initially required demo to show proper techinque for stepping THROUGH with second step to minimize posterior sway/momentum    Gait Comments --      High Level Balance   High Level Balance Activities Other (comment)    High Level Balance Comments rockerboard in ante/post direction x 10 taps each direction followed by 30 second "hold" level, 2 sets of each exercise with cues needed to foster appropriate hip strategies (decrease strong posterior lean/weight shift) and for posture maintenance. Fwd foot taps on bottom step of stairs (3 "light taps" fostering contralateral single limb stance) x 10, 2 sets bilaterally with close S due to increased sway with fatigue especially on second  set       Neuro Re-ed    Neuro Re-ed Details  On balance beam head turns/nods + multi quadrant reaching to challenge balance and cause her to half to utilize hip and stepping strategies to maintain balance. Progressed to posterior lean on PT's hands, with PT abruptly removing support, causing posterior stepping strategy to be patient's "forced" strategy to recover. Multiple attempts performed with PT required to provide multiple reps of up to mod A to prevent LOB larely due to delayed posterior stepping response and/or a posterior step that was too small to truly catch her. Mild improvement noted with repletion                     PT Short Term Goals - 06/20/19 1110      PT SHORT TERM GOAL #1   Title Patient will be independent with HEP to foster lower extremity strength and balance to decrease fall risk and improve strength. (ALL STGs due 06/22/19)    Baseline met for initial HEP on 06/16/19, provided pt with upgrades    Time 4    Period Weeks    Status Achieved    Target Date 06/22/19      PT SHORT TERM GOAL #2   Title Patient will score >/= 19/30 on the FGA to indicate a decrease in risk for falls.    Baseline MET 06/20/19 FGA = 19/30    Time 4    Period Weeks    Status Achieved      PT SHORT TERM GOAL #3   Title Patient will demonstrate ability to ambulate >/= 500 feet including indoor and outdoor level surfaces with supervision to indicate a decrease in fall risk with community ambulation.    Baseline MET 06/20/19    Time 4    Period Weeks    Status Achieved      PT SHORT TERM GOAL #4   Title Patient will demonstrate ability to navigate 8 steps (4 steps, 2 sets) with supervision and unilateral railing to indicate a decrease in fall risk with stair negotiation.    Baseline MET 06/20/19    Time 4    Period Weeks    Status Achieved      PT SHORT TERM GOAL #5   Title Patient will demonstrate ability to maintain single limb stance on each lower extremity for >/= 5 seconds  safety to indicate improvement in balance and decreae in fall risk.    Baseline approx. 3-4 seconds B.    Time 4    Period Weeks    Status Not Met             PT Long Term Goals - 05/23/19 1235      PT LONG TERM GOAL #1  Title Patient will be independent with HEP to foster lower extremity strength and balance to decrease fall risk and improve strength. (ALL LTGs DUE 07/22/19)    Time 8    Period Weeks    Status New    Target Date 07/22/19      PT LONG TERM GOAL #2   Title Patient will score >/= 24/30 on the FGA to demonstrate a decrease in fall risk.    Time 8    Period Weeks    Status New      PT LONG TERM GOAL #3   Title Patient's gait velocity will be >/= 3.44 ft/sec with normalized gait pattern to demonstrate increased safety when ambulating in the community.    Time 8    Period Weeks    Status New      PT LONG TERM GOAL #4   Title Patient will demonstrate ability to ambulate >/= 1000 feet including indoor, outdoor, level, unlevel surfaces to demonstrate a decrease in risk of falls when ambulating for exercise/leisure with friends.    Time 8    Period Weeks    Status New      PT LONG TERM GOAL #5   Title Patient will demonstrate ability to climb and descend 12 steps (full flight of stairs - 4 steps, 3 reps) with modified independence to demonstrate a decrease in fall risk.    Time 8    Period Weeks    Status New      Additional Long Term Goals   Additional Long Term Goals Yes      PT LONG TERM GOAL #6   Title Patient will demonstrate ability to maintain single limb stance on each lower extremity for >/= 10 seconds safety to indicate improvement in balance and decreae in fall risk.    Time 8    Period Weeks    Status New                 Plan - 07/11/19 1521    Clinical Impression Statement Today's skilled session focused on progression of stepping strategies - especially posterior - in order to increase safety and improve efficiency in balance  maintenance. She tolerated this well, but fatigues with ease requiring multiple seated rest breaks. Improvement noted in proper utilization of hip streategies with today's activities. Patient is making progress and will benefit from continued skilled PT working towards [place].    Personal Factors and Comorbidities Age;Comorbidity 1    Comorbidities heart murmur, B TKAs, spinal stenosis    Examination-Activity Limitations Bend;Lift;Squat;Locomotion Level;Stairs;Reach Overhead;Carry;Stand    Examination-Participation Restrictions Cleaning;Laundry;Shop;Driving;Meal Prep;Community Activity    Stability/Clinical Decision Making Evolving/Moderate complexity    Rehab Potential Excellent    PT Frequency 2x / week    PT Duration 8 weeks    PT Treatment/Interventions ADLs/Self Care Home Management;Aquatic Therapy;Moist Heat;Electrical Stimulation;Cryotherapy;Ultrasound;DME Instruction;Gait training;Therapeutic exercise;Therapeutic activities;Functional mobility training;Stair training;Balance training;Neuromuscular re-education;Patient/family education;Manual techniques;Orthotic Fit/Training;Passive range of motion;Energy conservation    PT Next Visit Plan working towards single limb stance tolerance/maintenance, corner balance, posterior stepping strategies and balance with head motions, gait with changes in direction/sudden starts and stops - review on curbs, obstacle negotiation, dual tasking    PT Home Exercise Plan [id number]    Consulted and Agree with Plan of Care Patient           Patient will benefit from skilled therapeutic intervention in order to improve the following deficits and impairments:  Abnormal gait, Decreased balance, Decreased endurance, Decreased mobility, Difficulty walking,  Decreased knowledge of precautions, Decreased range of motion, Impaired perceived functional ability, Improper body mechanics, Decreased activity tolerance, Decreased knowledge of use of DME, Decreased safety  awareness, Decreased strength, Impaired flexibility, Postural dysfunction  Visit Diagnosis: Unsteadiness on feet  Muscle weakness (generalized)  Other abnormalities of gait and mobility     Problem List Patient Active Problem List   Diagnosis Date Noted   Asthma, chronic 04/18/2013   HTN (hypertension) 04/18/2013    [place], PT, DPT  07/11/2019, 3:23 PM  [facility] [address] [place], [place], [address] Phone: [phone]   Fax:  [phone]  Name: BRITTANIE DOSANJH MRN: [id number] Date of Birth: 03/07/1945

## 2019-07-14 ENCOUNTER — Ambulatory Visit: Payer: Medicare PPO | Admitting: Physical Therapy

## 2019-07-14 ENCOUNTER — Other Ambulatory Visit: Payer: Self-pay

## 2019-07-14 ENCOUNTER — Encounter: Payer: Self-pay | Admitting: Physical Therapy

## 2019-07-14 DIAGNOSIS — R262 Difficulty in walking, not elsewhere classified: Secondary | ICD-10-CM

## 2019-07-14 DIAGNOSIS — R2681 Unsteadiness on feet: Secondary | ICD-10-CM | POA: Diagnosis not present

## 2019-07-14 DIAGNOSIS — M6281 Muscle weakness (generalized): Secondary | ICD-10-CM

## 2019-07-14 DIAGNOSIS — R2689 Other abnormalities of gait and mobility: Secondary | ICD-10-CM

## 2019-07-14 NOTE — Therapy (Signed)
[facility] [address] [place], [place], [address] Phone: [phone]   Fax:  [phone]  Physical Therapy Treatment  Patient Details  Name: [name] MRN: [id number] Date of Birth: 30-Jan-1946 Referring Provider (PT): [place]   Encounter Date: 07/14/2019   PT End of Session - 07/14/19 1152    Visit Number 16    Number of Visits 17    Date for PT Re-Evaluation 07/22/19    Authorization Type [employer]   10th visit progress note   PT Start Time 1103    PT Stop Time 1144    PT Time Calculation (min) 41 min    Equipment Utilized During Treatment Gait belt    Activity Tolerance Patient tolerated treatment well    Behavior During Therapy [facility] for tasks assessed/performed           Past Medical History:  Diagnosis Date   Allergy    Hay fever/bee stings   Anxiety    Arthritis    Asthma    Blood transfusion without reported diagnosis 2012   Cataract    Depression    GERD (gastroesophageal reflux disease)    Heart murmur    Hypertension     Past Surgical History:  Procedure Laterality Date   [place]  2009   left shoulder near neck   REPLACEMENT TOTAL KNEE BILATERAL  05/2010 &05/2010    There were no vitals filed for this visit.   Subjective Assessment - 07/14/19 1106    Subjective Almost had a fall posteriorly, was changing directions but was able to maintain her balance. Feels more secure going up and down stairs. Is better on grass.    Pertinent History hx significant for falls, heart murmur, s/p B TKAs (most recent in 2012), spinal stenosis    Limitations Standing;Walking;Lifting;House hold activities    How long can you walk comfortably? 2-3 miles - I walk this with a friend 2x/wk    Patient Stated Goals I want to get my balance back; I want to be steadier on my feet and not worry about falling down    Currently in Pain?  No/denies              [facility] PT Assessment - 07/14/19 1112      High Level Balance   High Level Balance Comments SLS LLE=9.6 seconds, RLE= 10.1 seconds (after a couple reps)      Functional Gait  Assessment   Gait assessed  Yes    Gait Level Surface Walks 20 ft, slow speed, abnormal gait pattern, evidence for imbalance or deviates 10-15 in outside of the 12 in walkway width. Requires more than 7 sec to ambulate 20 ft.   7.9 seconds   Change in Gait Speed Able to smoothly change walking speed without loss of balance or gait deviation. Deviate no more than 6 in outside of the 12 in walkway width.    Gait with Horizontal Head Turns Performs head turns smoothly with slight change in gait velocity (eg, minor disruption to smooth gait path), deviates 6-10 in outside 12 in walkway width, or uses an assistive device.    Gait with Vertical Head Turns Performs head turns with no change in gait. Deviates no more than 6 in outside 12 in walkway width.    Gait and Pivot Turn Pivot turns safely within 3 sec and stops quickly with no loss of balance.    Step  Over Obstacle Is able to step over 2 stacked shoe boxes taped together (9 in total height) without changing gait speed. No evidence of imbalance.    Gait with Narrow Base of Support Ambulates 4-7 steps.    Gait with Eyes Closed Walks 20 ft, slow speed, abnormal gait pattern, evidence for imbalance, deviates 10-15 in outside 12 in walkway width. Requires more than 9 sec to ambulate 20 ft.    Ambulating Backwards Walks 20 ft, no assistive devices, good speed, no evidence for imbalance, normal gait    Steps Alternating feet, no rail.    Total Score 23    FGA comment: 23/30                         [facility] Adult PT Treatment/Exercise - 07/14/19 1112      Ambulation/Gait   Ambulation/Gait Yes    Ambulation/Gait Assistance 5: Supervision    Ambulation/Gait Assistance Details over concrete and grass surfaces conversing with therapist, no  LOB    Ambulation Distance (Feet) 1000 Feet    Assistive device None    Gait Pattern Decreased arm swing - right;Decreased arm swing - left;Decreased step length - right;Decreased step length - left;Decreased dorsiflexion - right;Trendelenburg;Poor foot clearance - right    Ambulation Surface Unlevel;Outdoor;Paved;Grass;Other (comment)   mulch   Gait velocity 11 seconds = 2.98 ft/sec    Stairs Yes    Stairs Assistance 5: Supervision;4: Min guard;6: Modified independent (Device/Increase time)    Stairs Assistance Details (indicate cue type and reason) min guard during intial rep of descending with no UE support, progressing to supervision with incr reps, able to descend and ascend with mod I using single handrail   Stair Management Technique No rails;One rail Left;Alternating pattern;Forwards    Number of Stairs 12    Height of Stairs 4    Gait Comments slow marching on mulch 4 x 15' with min guard for balance, cues for slowed and controlled                   PT Education - 07/14/19 1152    Education Details progress towards LTGs.    Person(s) Educated Patient    Methods Explanation    Comprehension Verbalized understanding            PT Short Term Goals - 06/20/19 1110      PT SHORT TERM GOAL #1   Title Patient will be independent with HEP to foster lower extremity strength and balance to decrease fall risk and improve strength. (ALL STGs due 06/22/19)    Baseline met for initial HEP on 06/16/19, provided pt with upgrades    Time 4    Period Weeks    Status Achieved    Target Date 06/22/19      PT SHORT TERM GOAL #2   Title Patient will score >/= 19/30 on the FGA to indicate a decrease in risk for falls.    Baseline MET 06/20/19 FGA = 19/30    Time 4    Period Weeks    Status Achieved      PT SHORT TERM GOAL #3   Title Patient will demonstrate ability to ambulate >/= 500 feet including indoor and outdoor level surfaces with supervision to indicate a decrease in fall  risk with community ambulation.    Baseline MET 06/20/19    Time 4    Period Weeks    Status Achieved      PT  SHORT TERM GOAL #4   Title Patient will demonstrate ability to navigate 8 steps (4 steps, 2 sets) with supervision and unilateral railing to indicate a decrease in fall risk with stair negotiation.    Baseline MET 06/20/19    Time 4    Period Weeks    Status Achieved      PT SHORT TERM GOAL #5   Title Patient will demonstrate ability to maintain single limb stance on each lower extremity for >/= 5 seconds safety to indicate improvement in balance and decreae in fall risk.    Baseline approx. 3-4 seconds B.    Time 4    Period Weeks    Status Not Met             PT Long Term Goals - 07/14/19 1113      PT LONG TERM GOAL #1   Title Patient will be independent with HEP to foster lower extremity strength and balance to decrease fall risk and improve strength. (ALL LTGs DUE 07/22/19)    Time 8    Period Weeks    Status New      PT LONG TERM GOAL #2   Title Patient will score >/= 24/30 on the FGA to demonstrate a decrease in fall risk.    Baseline 23/30    Time 8    Period Weeks    Status Partially Met      PT LONG TERM GOAL #3   Title Patient's gait velocity will be >/= 3.44 ft/sec with normalized gait pattern to demonstrate increased safety when ambulating in the community.    Baseline 11 seconds = 2.98 ft/sec    Time 8    Period Weeks    Status Not Met      PT LONG TERM GOAL #4   Title Patient will demonstrate ability to ambulate >/= 1000 feet including indoor, outdoor, level, unlevel surfaces to demonstrate a decrease in risk of falls when ambulating for exercise/leisure with friends.    Baseline met with supervision.    Time 8    Period Weeks    Status Achieved      PT LONG TERM GOAL #5   Title Patient will demonstrate ability to climb and descend 12 steps (full flight of stairs - 4 steps, 3 reps) with modified independence to demonstrate a decrease in fall  risk.    Baseline mod I ascending with no rails and reciprocal pattern, supervision/min guard descending with no rails and alternating, mod I with railing    Time 8    Period Weeks    Status Achieved      PT LONG TERM GOAL #6   Title Patient will demonstrate ability to maintain single limb stance on each lower extremity for >/= 10 seconds safety to indicate improvement in balance and decreae in fall risk.    Baseline SLS LLE=9.6 seconds, RLE= 10.1 seconds (after a couple reps)    Time 8    Period Weeks    Status Achieved                 Plan - 07/14/19 1205    Clinical Impression Statement Focus of today's skilled session was beginning to assess LTGs for anticipated d/c at next visit. Pt has met 3 out of 5 LTGs in regards to stair negotiation, SLS B, and gait outdoors over unlevel paved and grass surfaces. Pt able to ambulate 1000 ft outdoors while conversing with therapist over different terrain with no LOB. Pt  did not meet LTG #3 in regards to gait speed. 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 Pt partially met LTG #2 in regards to FGA, scored a 23/30 today (at eval was a 14/30), indicating pt is at a moderate risk for falls. Pt is pleased with her progress in PT, will review HEP next session and upgrade as needed, with pt in agreement to discharge.    Personal Factors and Comorbidities Age;Comorbidity 1    Comorbidities heart murmur, B TKAs, spinal stenosis    Examination-Activity Limitations Bend;Lift;Squat;Locomotion Level;Stairs;Reach Overhead;Carry;Stand    Examination-Participation Restrictions Cleaning;Laundry;Shop;Driving;Meal Prep;Community Activity    Stability/Clinical Decision Making Evolving/Moderate complexity    Rehab Potential Excellent    PT Frequency 2x / week    PT Duration 8 weeks    PT Treatment/Interventions ADLs/Self Care Home Management;Aquatic Therapy;Moist Heat;Electrical Stimulation;Cryotherapy;Ultrasound;DME Instruction;Gait  training;Therapeutic exercise;Therapeutic activities;Functional mobility training;Stair training;Balance training;Neuromuscular re-education;Patient/family education;Manual techniques;Orthotic Fit/Training;Passive range of motion;Energy conservation    PT Next Visit Plan how was the beach? finalize HEP. D/C    PT Home Exercise Plan [id number]    Consulted and Agree with Plan of Care Patient           Patient will benefit from skilled therapeutic intervention in order to improve the following deficits and impairments:  Abnormal gait, Decreased balance, Decreased endurance, Decreased mobility, Difficulty walking, Decreased knowledge of precautions, Decreased range of motion, Impaired perceived functional ability, Improper body mechanics, Decreased activity tolerance, Decreased knowledge of use of DME, Decreased safety awareness, Decreased strength, Impaired flexibility, Postural dysfunction  Visit Diagnosis: Unsteadiness on feet  Muscle weakness (generalized)  Other abnormalities of gait and mobility  Difficulty in walking, not elsewhere classified     Problem List Patient Active Problem List   Diagnosis Date Noted   Asthma, chronic 04/18/2013   HTN (hypertension) 04/18/2013    Arliss Journey, PT, DPT  07/14/2019, 12:06 PM  [facility] [address] [place], [place], [address] Phone: [phone]   Fax:  [phone]  Name: [name] MRN: [id number] Date of Birth: 08-07-1945

## 2019-07-17 ENCOUNTER — Ambulatory Visit: Payer: Medicare Other | Admitting: Physician Assistant

## 2019-07-17 ENCOUNTER — Ambulatory Visit: Payer: Medicare PPO | Admitting: Physical Therapy

## 2019-07-22 ENCOUNTER — Ambulatory Visit: Payer: Medicare PPO | Admitting: Physical Therapy

## 2019-07-22 ENCOUNTER — Other Ambulatory Visit: Payer: Self-pay

## 2019-07-22 DIAGNOSIS — R2681 Unsteadiness on feet: Secondary | ICD-10-CM

## 2019-07-22 DIAGNOSIS — Z9181 History of falling: Secondary | ICD-10-CM

## 2019-07-22 DIAGNOSIS — R2689 Other abnormalities of gait and mobility: Secondary | ICD-10-CM

## 2019-07-22 DIAGNOSIS — M6281 Muscle weakness (generalized): Secondary | ICD-10-CM

## 2019-07-22 DIAGNOSIS — R262 Difficulty in walking, not elsewhere classified: Secondary | ICD-10-CM

## 2019-07-22 NOTE — Therapy (Signed)
[facility] [address] [place], [place], [address] Phone: [phone]   Fax:  [phone]  Physical Therapy Treatment/Discharge Summary  Patient Details  Name: [name] MRN: [id number] Date of Birth: 17-Mar-1945 Referring Provider (PT): Jackson [place]   Encounter Date: 07/22/2019   PT End of Session - 07/22/19 1707    Visit Number 17    Number of Visits 17    Date for PT Re-Evaluation 07/22/19    Authorization Type [employer]   10th visit progress note   PT Start Time 1620    PT Stop Time 1658    PT Time Calculation (min) 38 min    Equipment Utilized During Treatment Gait belt    Activity Tolerance Patient tolerated treatment well    Behavior During Therapy [facility] for tasks assessed/performed           Past Medical History:  Diagnosis Date  . Allergy    Hay fever/bee stings  . Anxiety   . Arthritis   . Asthma   . Blood transfusion without reported diagnosis 2012  . Cataract   . Depression   . GERD (gastroesophageal reflux disease)   . Heart murmur   . Hypertension     Past Surgical History:  Procedure Laterality Date  . ABDOMINAL HYSTERECTOMY  1986  . MELANOMA EXCISION  2009   left shoulder near neck  . REPLACEMENT TOTAL KNEE BILATERAL  05/2010 &05/2010    There were no vitals filed for this visit.   Subjective Assessment - 07/22/19 1623    Subjective the beach was awesome - was able to walk right out of the water. Had to go up and down 30 steps for the airbnb. No falls. When she was walking at the yard sale over uneven surfaces, was able to catch her balance one time when she needed to take a big step.    Pertinent History hx significant for falls, heart murmur, s/p B TKAs (most recent in 2012), spinal stenosis    Limitations Standing;Walking;Lifting;House hold activities    How long can you walk comfortably? 2-3 miles - I walk this with a friend 2x/wk    Patient Stated Goals I want  to get my balance back; I want to be steadier on my feet and not worry about falling down    Currently in Pain? No/denies                                   Access Code: [email or web address] URL: [email or web address] Date: 07/22/2019 Prepared by: Janann August  Reviewed/upgraded final HEP below:   Program Notes wall bumps: standing on your foam pad with feet apart - and wall behind you with chair in front of you for safety, bumping your bottom back to wall until it touches (bending forward from the hips) and using your hips to come forward (standing up nice and tall and bringing your belly button forwards)   Exercises Standing Marching - 1 x daily - 5 x weekly - 3 sets - cues for slowed and controlled and holding for 3 seconds for incr SLS  Heel Walking - 1 x daily - 5 x weekly - 3 sets Tandem Walking with Counter Support - 1 x daily - 5 x weekly - 3 sets -forwards with no/intermittent UE support, retro tandem with fingertip support Standing Balance with Eyes Closed on Foam - 1 x daily - 5 x  weekly - 2 sets - 10 reps - with feet apart, 2 x 10 reps head turns, 2 x 10 reps head nods  Standing Romberg to 3/4 Tandem Stance - 2 x daily - 5 x weekly - 3 sets - 10-15 hold Alternating Backward Step - 1 x daily - 5 x weekly - 2 sets - 10 reps     PT Education - 07/22/19 1707    Education Details reviewed/upgraded final HEP    Person(s) Educated Patient    Methods Explanation;Demonstration;Handout;Verbal cues    Comprehension Verbalized understanding;Returned demonstration            PT Short Term Goals - 06/20/19 1110      PT SHORT TERM GOAL #1   Title Patient will be independent with HEP to foster lower extremity strength and balance to decrease fall risk and improve strength. (ALL STGs due 06/22/19)    Baseline met for initial HEP on 06/16/19, provided pt with upgrades    Time 4    Period Weeks    Status Achieved    Target Date 06/22/19      PT SHORT  TERM GOAL #2   Title Patient will score >/= 19/30 on the FGA to indicate a decrease in risk for falls.    Baseline MET 06/20/19 FGA = 19/30    Time 4    Period Weeks    Status Achieved      PT SHORT TERM GOAL #3   Title Patient will demonstrate ability to ambulate >/= 500 feet including indoor and outdoor level surfaces with supervision to indicate a decrease in fall risk with community ambulation.    Baseline MET 06/20/19    Time 4    Period Weeks    Status Achieved      PT SHORT TERM GOAL #4   Title Patient will demonstrate ability to navigate 8 steps (4 steps, 2 sets) with supervision and unilateral railing to indicate a decrease in fall risk with stair negotiation.    Baseline MET 06/20/19    Time 4    Period Weeks    Status Achieved      PT SHORT TERM GOAL #5   Title Patient will demonstrate ability to maintain single limb stance on each lower extremity for >/= 5 seconds safety to indicate improvement in balance and decreae in fall risk.    Baseline approx. 3-4 seconds B.    Time 4    Period Weeks    Status Not Met             PT Long Term Goals - 07/22/19 1714      PT LONG TERM GOAL #1   Title Patient will be independent with HEP to foster lower extremity strength and balance to decrease fall risk and improve strength. (ALL LTGs DUE 07/22/19)    Time 8    Period Weeks    Status Achieved      PT LONG TERM GOAL #2   Title Patient will score >/= 24/30 on the FGA to demonstrate a decrease in fall risk.    Baseline 23/30    Time 8    Period Weeks    Status Partially Met      PT LONG TERM GOAL #3   Title Patient's gait velocity will be >/= 3.44 ft/sec with normalized gait pattern to demonstrate increased safety when ambulating in the community.    Baseline 11 seconds = 2.98 ft/sec    Time 8    Period  Weeks    Status Not Met      PT LONG TERM GOAL #4   Title Patient will demonstrate ability to ambulate >/= 1000 feet including indoor, outdoor, level, unlevel surfaces  to demonstrate a decrease in risk of falls when ambulating for exercise/leisure with friends.    Baseline met with supervision.    Time 8    Period Weeks    Status Achieved      PT LONG TERM GOAL #5   Title Patient will demonstrate ability to climb and descend 12 steps (full flight of stairs - 4 steps, 3 reps) with modified independence to demonstrate a decrease in fall risk.    Baseline mod I ascending with no rails and reciprocal pattern, supervision/min guard descending with no rails and alternating, mod I with railing    Time 8    Period Weeks    Status Achieved      PT LONG TERM GOAL #6   Title Patient will demonstrate ability to maintain single limb stance on each lower extremity for >/= 10 seconds safety to indicate improvement in balance and decreae in fall risk.    Baseline SLS LLE=9.6 seconds, RLE= 10.1 seconds (after a couple reps)    Time 8    Period Weeks    Status Achieved           PHYSICAL THERAPY DISCHARGE SUMMARY  Visits from Start of Care: 17  Current functional level related to goals / functional outcomes: See LTGs   Remaining deficits: Impaired vestibular input for balance, decr SLS   Education / Equipment: HEP   Plan: Patient agrees to discharge.  Patient goals were met. Patient is being discharged due to meeting the stated rehab goals.  ?????         Patient will benefit from skilled therapeutic intervention in order to improve the following deficits and impairments:  Abnormal gait, Decreased balance, Decreased endurance, Decreased mobility, Difficulty walking, Decreased knowledge of precautions, Decreased range of motion, Impaired perceived functional ability, Improper body mechanics, Decreased activity tolerance, Decreased knowledge of use of DME, Decreased safety awareness, Decreased strength, Impaired flexibility, Postural dysfunction  Visit Diagnosis: Unsteadiness on feet  Muscle weakness (generalized)  Other abnormalities of gait and  mobility  Difficulty in walking, not elsewhere classified  History of falling     Problem List Patient Active Problem List   Diagnosis Date Noted  . Asthma, chronic 04/18/2013  . HTN (hypertension) 04/18/2013    Arliss Journey, PT, DPT  07/22/2019, 5:15 PM  [facility] [address] [place], [place], [address] Phone: [phone]   Fax:  [phone]  Name: VANTASIA PINKNEY MRN: [id number] Date of Birth: 02-05-1945

## 2019-07-22 NOTE — Patient Instructions (Signed)
Access Code: [email or web address] URL: [email or web address] Date: 07/22/2019 Prepared by: Janann August  Program Notes wall bumps: standing on your foam pad with feet apart - and wall behind you with chair in front of you for safety, bumping your bottom back to wall until it touches (bending forward from the hips) and using your hips to come forward (standing up nice and tall and bringing your belly button forwards)   Exercises Standing Marching - 1 x daily - 5 x weekly - 3 sets Heel Walking - 1 x daily - 5 x weekly - 3 sets Tandem Walking with Counter Support - 1 x daily - 5 x weekly - 3 sets Standing Balance with Eyes Closed on Foam - 1 x daily - 5 x weekly - 2 sets - 10 reps Standing Romberg to 3/4 Tandem Stance - 2 x daily - 5 x weekly - 3 sets - 10-15 hold Alternating Backward Step - 1 x daily - 5 x weekly - 2 sets - 10 reps

## 2019-07-28 ENCOUNTER — Ambulatory Visit
Admission: RE | Admit: 2019-07-28 | Discharge: 2019-07-28 | Disposition: A | Discharge status: Home / Self Care | Payer: Medicare PPO | Source: Ambulatory Visit | Attending: Internal Medicine | Admitting: Internal Medicine

## 2019-07-28 ENCOUNTER — Other Ambulatory Visit: Payer: Self-pay

## 2019-07-28 ENCOUNTER — Ambulatory Visit: Payer: Medicare PPO

## 2019-07-28 DIAGNOSIS — Z1231 Encounter for screening mammogram for malignant neoplasm of breast: Secondary | ICD-10-CM

## 2019-09-11 ENCOUNTER — Other Ambulatory Visit: Payer: Self-pay

## 2019-09-11 ENCOUNTER — Telehealth: Payer: Self-pay | Admitting: Physician Assistant

## 2019-09-11 ENCOUNTER — Encounter: Payer: Self-pay | Admitting: Physician Assistant

## 2019-09-11 ENCOUNTER — Ambulatory Visit: Payer: Medicare PPO | Admitting: Physician Assistant

## 2019-09-11 DIAGNOSIS — Z1283 Encounter for screening for malignant neoplasm of skin: Secondary | ICD-10-CM | POA: Diagnosis not present

## 2019-09-11 DIAGNOSIS — D485 Neoplasm of uncertain behavior of skin: Secondary | ICD-10-CM | POA: Diagnosis not present

## 2019-09-11 DIAGNOSIS — L814 Other melanin hyperpigmentation: Secondary | ICD-10-CM

## 2019-09-11 DIAGNOSIS — L821 Other seborrheic keratosis: Secondary | ICD-10-CM

## 2019-09-11 DIAGNOSIS — D1800 Hemangioma unspecified site: Secondary | ICD-10-CM

## 2019-09-11 DIAGNOSIS — Z86018 Personal history of other benign neoplasm: Secondary | ICD-10-CM

## 2019-09-11 DIAGNOSIS — L578 Other skin changes due to chronic exposure to nonionizing radiation: Secondary | ICD-10-CM

## 2019-09-11 DIAGNOSIS — Z86006 Personal history of melanoma in-situ: Secondary | ICD-10-CM

## 2019-09-11 DIAGNOSIS — L570 Actinic keratosis: Secondary | ICD-10-CM

## 2019-09-11 DIAGNOSIS — L820 Inflamed seborrheic keratosis: Secondary | ICD-10-CM

## 2019-09-11 DIAGNOSIS — D229 Melanocytic nevi, unspecified: Secondary | ICD-10-CM | POA: Diagnosis not present

## 2019-09-11 MED ORDER — NONFORMULARY OR COMPOUNDED ITEM: 6 refills | Status: DC

## 2019-09-11 NOTE — Telephone Encounter (Signed)
KRS wrote Rx for fluconazole this morning + it was sent to [facility]. She says she usually gets it from [facility] and they compound it for her.

## 2019-09-11 NOTE — Progress Notes (Addendum)
Follow-Up Visit   Subjective  [name] is a 74 y.o. female who presents for the following: Follow-up (31mo right wrist?change, and right calf). Thick brown crusts.   The following portions of the chart were reviewed this encounter and updated as appropriate: Tobacco  Allergies  Meds  Problems  Med Hx  Surg Hx  Fam Hx      Objective  Well appearing patient in no apparent distress; mood and affect are within normal limits.  A full examination was performed including scalp, head, eyes, ears, nose, lips, neck, chest, axillae, abdomen, back, buttocks, bilateral upper extremities, bilateral lower extremities, hands, feet, fingers, toes, fingernails, and toenails. All findings within normal limits unless otherwise noted below.  Objective  Left Upper Back: Scar clear  Objective  Left Lower Back: Scars clear  Objective  Head to toe: No signs of non-mole skin cancer.   Right Wrist - Posterior     Objective  Left Supraclavicular Area (5), Right Supraclavicular Area (5): Erythematous stuck-on, waxy papule or plaque.   Assessment & Plan  Personal history of melanoma in-situ Left Upper Back  observe  Atypical nevi  History of dysplastic nevus Left Lower Back  Screening exam for skin cancer Head to toe  Biannual skin exams  Neoplasm of uncertain behavior of skin Right Wrist - Posterior  Skin / nail biopsy Type of biopsy: tangential   Informed consent: discussed and consent obtained   Timeout: patient name, date of birth, surgical site, and procedure verified   Procedure prep:  Patient was prepped and draped in usual sterile fashion Prep type:  Chlorhexidine Anesthesia: the lesion was anesthetized in a standard fashion   Anesthetic:  1% lidocaine w/ epinephrine 1-100,000 local infiltration Instrument used: DermaBlade   Hemostasis achieved with: aluminum chloride   Outcome: patient tolerated procedure well   Post-procedure details: sterile dressing  applied and wound care instructions given   Dressing type: petrolatum   Additional details:  0.22mm  Specimen 1 - Surgical pathology Differential Diagnosis: atypia Check Margins: yes  Actinic keratosis (10) Left Supraclavicular Area (5); Right Supraclavicular Area (5)  Destruction of lesion - Left Supraclavicular Area, Right Supraclavicular Area Complexity: simple   Destruction method: cryotherapy   Informed consent: discussed and consent obtained   Timeout:  patient name, date of birth, surgical site, and procedure verified Lesion destroyed using liquid nitrogen: Yes   Cryotherapy cycles:  1 Outcome: patient tolerated procedure well with no complications   Post-procedure details: wound care instructions given    Lentigines - Scattered tan macules - Discussed due to sun exposure - Benign, observe - Call for any changes  Seborrheic Keratoses - Stuck-on, waxy, tan-brown papules and plaques  - Discussed benign etiology and prognosis. - Observe - Call for any changes  Melanocytic Nevi - Tan-brown and/or pink-flesh-colored symmetric macules and papules - Benign appearing on exam today - Observation - Call clinic for new or changing moles - Recommend daily use of broad spectrum spf 30+ sunscreen to sun-exposed areas.   Hemangiomas - Red papules - Discussed benign nature - Observe - Call for any changes  Actinic Damage - diffuse scaly erythematous macules with underlying dyspigmentation - Recommend daily broad spectrum sunscreen SPF 30+ to sun-exposed areas, reapply every 2 hours as needed.  - Call for new or changing lesions.  Skin cancer screening performed today. I, Jacoby Ritsema, PA-C, have reviewed all documentation's for this visit.  The documentation on 09/17/19 for the exam, diagnosis, procedures and orders are all accurate  and complete.

## 2019-09-11 NOTE — Patient Instructions (Signed)
Biopsy, Surgery (Curettage) & Surgery (Excision) Aftercare Instructions  1. Okay to remove bandage in 24 hours  2. Wash area with soap and water  3. Apply Vaseline to area twice daily until healed (Not Neosporin)  4. Okay to cover with a Band-Aid to decrease the chance of infection or prevent irritation from clothing; also it's okay to uncover lesion at home.  5. Suture instructions: return to our office in 7-10 or 10-14 days for a nurse visit for suture removal. Variable healing with sutures, if pain or itching occurs call our office. It's okay to shower or bathe 24 hours after sutures are given.  6. The following risks may occur after a biopsy, curettage or excision: bleeding, scarring, discoloration, recurrence, infection (redness, yellow drainage, pain or swelling).  7. If your results are positive we will contact you, if you do not hear from [place] review your MyChart.  Stay Well

## 2019-09-18 ENCOUNTER — Telehealth: Payer: Self-pay | Admitting: Physician Assistant

## 2019-09-18 NOTE — Telephone Encounter (Signed)
Results. Says mychart says the results are in but doesn't say what they were

## 2019-09-18 NOTE — Telephone Encounter (Signed)
Path to patient  

## 2019-10-24 ENCOUNTER — Other Ambulatory Visit: Payer: Self-pay | Admitting: General Surgery

## 2019-10-24 DIAGNOSIS — R1011 Right upper quadrant pain: Secondary | ICD-10-CM

## 2019-10-27 ENCOUNTER — Ambulatory Visit
Admission: RE | Admit: 2019-10-27 | Discharge: 2019-10-27 | Disposition: A | Discharge status: Home / Self Care | Payer: Medicare PPO | Source: Ambulatory Visit | Attending: General Surgery | Admitting: General Surgery

## 2019-10-27 DIAGNOSIS — R1011 Right upper quadrant pain: Secondary | ICD-10-CM

## 2019-11-07 ENCOUNTER — Other Ambulatory Visit: Payer: Self-pay | Admitting: General Surgery

## 2019-11-07 DIAGNOSIS — R1011 Right upper quadrant pain: Secondary | ICD-10-CM

## 2019-11-18 ENCOUNTER — Ambulatory Visit: Payer: Medicare PPO

## 2019-12-03 ENCOUNTER — Ambulatory Visit (HOSPITAL_COMMUNITY)
Admission: RE | Admit: 2019-12-03 | Discharge: 2019-12-03 | Disposition: A | Discharge status: Home / Self Care | Payer: Medicare PPO | Source: Ambulatory Visit | Attending: General Surgery | Admitting: General Surgery

## 2019-12-03 ENCOUNTER — Other Ambulatory Visit: Payer: Self-pay

## 2019-12-03 DIAGNOSIS — R1011 Right upper quadrant pain: Secondary | ICD-10-CM | POA: Insufficient documentation

## 2019-12-03 MED ORDER — TECHNETIUM TC 99M MEBROFENIN IV KIT
5.1000 | PACK | Freq: Once | INTRAVENOUS | Status: AC | PRN
  Administered 2019-12-03: 5.1 via INTRAVENOUS

## 2019-12-15 ENCOUNTER — Encounter: Payer: Self-pay | Admitting: Nurse Practitioner

## 2019-12-18 ENCOUNTER — Ambulatory Visit: Payer: Self-pay | Admitting: General Surgery

## 2019-12-19 ENCOUNTER — Encounter (HOSPITAL_COMMUNITY)
Admission: RE | Admit: 2019-12-19 | Discharge: 2019-12-19 | Disposition: A | Discharge status: Home / Self Care | Payer: Medicare PPO | Source: Ambulatory Visit | Attending: General Surgery | Admitting: General Surgery

## 2019-12-19 ENCOUNTER — Encounter (HOSPITAL_COMMUNITY): Payer: Self-pay

## 2019-12-19 ENCOUNTER — Other Ambulatory Visit: Payer: Self-pay

## 2019-12-19 DIAGNOSIS — Z01818 Encounter for other preprocedural examination: Secondary | ICD-10-CM | POA: Diagnosis present

## 2019-12-19 HISTORY — DX: Pneumonia, unspecified organism: J18.9

## 2019-12-19 HISTORY — DX: Anemia, unspecified: D64.9

## 2019-12-19 HISTORY — DX: Personal history of urinary calculi: Z87.442

## 2019-12-19 LAB — COMPREHENSIVE METABOLIC PANEL
ALT: 13 U/L (ref 0–44)
AST: 19 U/L (ref 15–41)
Albumin: 3.6 g/dL (ref 3.5–5.0)
Alkaline Phosphatase: 57 U/L (ref 38–126)
Anion gap: 8 (ref 5–15)
BUN: 11 mg/dL (ref 8–23)
CO2: 25 mmol/L (ref 22–32)
Calcium: 8.5 mg/dL — ABNORMAL LOW (ref 8.9–10.3)
Chloride: 109 mmol/L (ref 98–111)
Creatinine, Ser: 0.91 mg/dL (ref 0.44–1.00)
GFR, Estimated: >60 mL/min (ref >60)
Glucose, Bld: 104 mg/dL — ABNORMAL HIGH (ref 70–99)
Potassium: 4.2 mmol/L (ref 3.5–5.1)
Sodium: 142 mmol/L (ref 135–145)
Total Bilirubin: 0.7 mg/dL (ref 0.3–1.2)
Total Protein: 6.8 g/dL (ref 6.5–8.1)

## 2019-12-19 LAB — CBC WITH DIFFERENTIAL/PLATELET
Abs Immature Granulocytes: 0.02 10*3/uL (ref 0.00–0.07)
Basophils Absolute: 0 10*3/uL (ref 0.0–0.1)
Basophils Relative: 1 %
Eosinophils Absolute: 0.3 10*3/uL (ref 0.0–0.5)
Eosinophils Relative: 4 %
HCT: 32.4 % — ABNORMAL LOW (ref 36.0–46.0)
Hemoglobin: 10.1 g/dL — ABNORMAL LOW (ref 12.0–15.0)
Immature Granulocytes: 0 %
Lymphocytes Relative: 28 %
Lymphs Abs: 2.1 10*3/uL (ref 0.7–4.0)
MCH: 27.4 pg (ref 26.0–34.0)
MCHC: 31.2 g/dL (ref 30.0–36.0)
MCV: 87.8 fL (ref 80.0–100.0)
Monocytes Absolute: 0.6 10*3/uL (ref 0.1–1.0)
Monocytes Relative: 9 %
Neutro Abs: 4.3 10*3/uL (ref 1.7–7.7)
Neutrophils Relative %: 58 %
Platelets: 295 10*3/uL (ref 150–400)
RBC: 3.69 MIL/uL — ABNORMAL LOW (ref 3.87–5.11)
RDW: 14.8 % (ref 11.5–15.5)
WBC: 7.4 10*3/uL (ref 4.0–10.5)
nRBC: 0 % (ref 0.0–0.2)

## 2019-12-19 NOTE — Progress Notes (Addendum)
Anesthesia Review:  PCP: DR Jacalyn Lefevre on [address]  REquested most recent office visit note and labs on pt on 12/19/2019.  LOV 07/10/19 received and BMP 07/10/19 on chart  Cardiologist : Chest x-ray : EKG :12/19/2019- FINAL  Echo : Stress test: Cardiac Cath :  Activity level: can do a flight of stairs without difficulty  Sleep Study/ CPAP : none  Fasting Blood Sugar :      / Checks Blood Sugar -- times a day:   Blood Thinner/ Instructions /Last Dose: ASA / Instructions/ Last Dose :  CBC result of 12/19/2019 routed to DR Redmond Pulling.

## 2019-12-19 NOTE — Progress Notes (Signed)
CBC done 12/19/2019 routed to DR Greer Pickerel.

## 2019-12-19 NOTE — Progress Notes (Signed)
DUE TO COVID-19 ONLY ONE VISITOR IS ALLOWED TO COME WITH YOU AND STAY IN THE WAITING ROOM ONLY DURING PRE OP AND PROCEDURE DAY OF SURGERY. THE 1 VISITOR  MAY VISIT WITH YOU AFTER SURGERY IN YOUR PRIVATE ROOM DURING VISITING HOURS ONLY!  YOU NEED TO HAVE A COVID 19 TEST ON__11/26/2021 _____ @_______ , THIS TEST MUST BE DONE BEFORE SURGERY,  COVID TESTING SITE [address] [place] [address], IT IS ON THE RIGHT GOING OUT [address] APPROXIMATELY  2 MINUTES PAST [employer] ON THE RIGHT. ONCE YOUR COVID TEST IS COMPLETED,  PLEASE BEGIN THE QUARANTINE INSTRUCTIONS AS OUTLINED IN YOUR HANDOUT.                [name]  12/19/2019   Your procedure is scheduled on: 12/29/2019    Report to [facility] Main  Entrance   Report to admitting at    1130 AM     Call this number if you have problems the morning of surgery [phone]    REMEMBER: NO  SOLID FOOD CANDY OR GUM AFTER MIDNIGHT. CLEAR LIQUIDS UNTIL   1030am       . NOTHING BY MOUTH EXCEPT CLEAR LIQUIDS UNTIL    . PLEASE FINISH ENSURE DRINK PER SURGEON ORDER  WHICH NEEDS TO BE COMPLETED AT  1030am    .      CLEAR LIQUID DIET   Foods Allowed                                                                    Coffee and tea, regular and decaf                            Fruit ices (not with fruit pulp)                                      Iced Popsicles                                    Carbonated beverages, regular and diet                                    Cranberry, grape and apple juices Sports drinks like Gatorade Lightly seasoned clear broth or consume(fat free) Sugar, honey syrup ___________________________________________________________________      BRUSH YOUR TEETH MORNING OF SURGERY AND RINSE YOUR MOUTH OUT, NO CHEWING GUM CANDY OR MINTS.     Take these medicines the morning of surgery with A SIP OF WATER:  Omeprazole, Flonase, Prozac, Cardura, Atenolol, Inhalers as usual and  bring  DO NOT TAKE ANY DIABETIC MEDICATIONS DAY OF YOUR SURGERY                               You may not have any metal on your body including hair pins and  piercings  Do not wear jewelry, make-up, lotions, powders or perfumes, deodorant             Do not wear nail polish on your fingernails.  Do not shave  48 hours prior to surgery.              Men may shave face and neck.   Do not bring valuables to the hospital. [place].  Contacts, dentures or bridgework may not be worn into surgery.  Leave suitcase in the car. After surgery it may be brought to your room.     Patients discharged the day of surgery will not be allowed to drive home. IF YOU ARE HAVING SURGERY AND GOING HOME THE SAME DAY, YOU MUST HAVE AN ADULT TO DRIVE YOU HOME AND BE WITH YOU FOR 24 HOURS. YOU MAY GO HOME BY TAXI OR UBER OR ORTHERWISE, BUT AN ADULT MUST ACCOMPANY YOU HOME AND STAY WITH YOU FOR 24 HOURS.  Name and phone number of your driver:  Special Instructions: N/A              Please read over the following fact sheets you were given: _____________________________________________________________________  [facility] - Preparing for Surgery Before surgery, you can play an important role.  Because skin is not sterile, your skin needs to be as free of germs as possible.  You can reduce the number of germs on your skin by washing with CHG (chlorahexidine gluconate) soap before surgery.  CHG is an antiseptic cleaner which kills germs and bonds with the skin to continue killing germs even after washing. Please DO NOT use if you have an allergy to CHG or antibacterial soaps.  If your skin becomes reddened/irritated stop using the CHG and inform your nurse when you arrive at Short Stay. Do not shave (including legs and underarms) for at least 48 hours prior to the first CHG shower.  You may shave your face/neck. Please follow these instructions carefully:  1.   Shower with CHG Soap the night before surgery and the  morning of Surgery.  2.  If you choose to wash your hair, wash your hair first as usual with your  normal  shampoo.  3.  After you shampoo, rinse your hair and body thoroughly to remove the  shampoo.                           4.  Use CHG as you would any other liquid soap.  You can apply chg directly  to the skin and wash                       Gently with a scrungie or clean washcloth.  5.  Apply the CHG Soap to your body ONLY FROM THE NECK DOWN.   Do not use on face/ open                           Wound or open sores. Avoid contact with eyes, ears mouth and genitals (private parts).                       Wash face,  Genitals (private parts) with your normal soap.             6.  Wash thoroughly, paying special attention to the area where your surgery  will be performed.  7.  Thoroughly rinse your body with warm water from the neck down.  8.  DO NOT shower/wash with your normal soap after using and rinsing off  the CHG Soap.                9.  Pat yourself dry with a clean towel.            10.  Wear clean pajamas.            11.  Place clean sheets on your bed the night of your first shower and do not  sleep with pets. Day of Surgery : Do not apply any lotions/deodorants the morning of surgery.  Please wear clean clothes to the hospital/surgery center.  FAILURE TO FOLLOW THESE INSTRUCTIONS MAY RESULT IN THE CANCELLATION OF YOUR SURGERY PATIENT SIGNATURE_________________________________  NURSE SIGNATURE__________________________________  ________________________________________________________________________

## 2019-12-26 ENCOUNTER — Other Ambulatory Visit (HOSPITAL_COMMUNITY): Payer: Medicare PPO

## 2019-12-26 ENCOUNTER — Other Ambulatory Visit (HOSPITAL_COMMUNITY)
Admission: RE | Admit: 2019-12-26 | Discharge: 2019-12-26 | Disposition: A | Discharge status: Home / Self Care | Payer: Medicare PPO | Source: Ambulatory Visit | Attending: General Surgery | Admitting: General Surgery

## 2019-12-26 DIAGNOSIS — Z01812 Encounter for preprocedural laboratory examination: Secondary | ICD-10-CM | POA: Diagnosis present

## 2019-12-26 DIAGNOSIS — Z20822 Contact with and (suspected) exposure to covid-19: Secondary | ICD-10-CM | POA: Diagnosis not present

## 2019-12-26 LAB — SARS CORONAVIRUS 2 (TAT 6-24 HRS): SARS Coronavirus 2: NEGATIVE

## 2019-12-29 ENCOUNTER — Encounter (HOSPITAL_COMMUNITY): Payer: Self-pay | Admitting: General Surgery

## 2019-12-29 ENCOUNTER — Ambulatory Visit (HOSPITAL_COMMUNITY): Payer: Medicare PPO | Admitting: Emergency Medicine

## 2019-12-29 ENCOUNTER — Ambulatory Visit (HOSPITAL_COMMUNITY): Payer: Medicare PPO | Admitting: Certified Registered"

## 2019-12-29 ENCOUNTER — Ambulatory Visit (HOSPITAL_COMMUNITY)
Admission: RE | Admit: 2019-12-29 | Discharge: 2019-12-29 | Disposition: A | Discharge status: Home / Self Care | Payer: Medicare PPO | Source: Ambulatory Visit | Attending: General Surgery | Admitting: General Surgery

## 2019-12-29 ENCOUNTER — Encounter (HOSPITAL_COMMUNITY)
Admission: RE | Disposition: A | Discharge status: Home / Self Care | Payer: Self-pay | Source: Ambulatory Visit | Attending: General Surgery

## 2019-12-29 ENCOUNTER — Other Ambulatory Visit: Payer: Self-pay

## 2019-12-29 DIAGNOSIS — K8010 Calculus of gallbladder with chronic cholecystitis without obstruction: Secondary | ICD-10-CM | POA: Insufficient documentation

## 2019-12-29 DIAGNOSIS — Z96653 Presence of artificial knee joint, bilateral: Secondary | ICD-10-CM | POA: Insufficient documentation

## 2019-12-29 SURGERY — CHOLECYSTECTOMY, ROBOT-ASSISTED, LAPAROSCOPIC
Anesthesia: General | Site: Abdomen

## 2019-12-29 MED ORDER — ENSURE PRE-SURGERY PO LIQD
296.0000 mL | Freq: Once | ORAL | Status: DC
  Filled 2019-12-29: qty 296

## 2019-12-29 MED ORDER — ONDANSETRON HCL 4 MG/2ML IJ SOLN: 4.0000 mg | Freq: Once | INTRAMUSCULAR | Status: DC | PRN

## 2019-12-29 MED ORDER — LACTATED RINGERS IR SOLN
Status: DC | PRN
  Administered 2019-12-29: 1000 mL

## 2019-12-29 MED ORDER — CHLORHEXIDINE GLUCONATE CLOTH 2 % EX PADS: 6.0000 | MEDICATED_PAD | Freq: Once | CUTANEOUS | Status: DC

## 2019-12-29 MED ORDER — HYDROMORPHONE HCL 1 MG/ML IJ SOLN
0.2500 mg | INTRAMUSCULAR | Status: DC | PRN
  Administered 2019-12-29 (×2): 0.25 mg via INTRAVENOUS

## 2019-12-29 MED ORDER — MEPERIDINE HCL 50 MG/ML IJ SOLN: 6.2500 mg | INTRAMUSCULAR | Status: DC | PRN

## 2019-12-29 MED ORDER — CHLORHEXIDINE GLUCONATE 0.12 % MT SOLN
15.0000 mL | Freq: Once | OROMUCOSAL | Status: AC
  Administered 2019-12-29: 15 mL via OROMUCOSAL

## 2019-12-29 MED ORDER — GABAPENTIN 100 MG PO CAPS
100.0000 mg | ORAL_CAPSULE | ORAL | Status: AC
  Administered 2019-12-29: 100 mg via ORAL
  Filled 2019-12-29: qty 1

## 2019-12-29 MED ORDER — KETOROLAC TROMETHAMINE 30 MG/ML IJ SOLN
INTRAMUSCULAR | Status: AC
  Filled 2019-12-29: qty 1

## 2019-12-29 MED ORDER — PROPOFOL 10 MG/ML IV BOLUS
INTRAVENOUS | Status: AC
  Filled 2019-12-29: qty 20

## 2019-12-29 MED ORDER — INDOCYANINE GREEN 25 MG IV SOLR
2.5000 mg | Freq: Once | INTRAVENOUS | Status: DC
Start: 2019-12-29 — End: 2019-12-29

## 2019-12-29 MED ORDER — TRAMADOL-ACETAMINOPHEN 37.5-325 MG PO TABS: 1.0000 | ORAL_TABLET | Freq: Three times a day (TID) | ORAL | 0 refills | Status: DC | PRN

## 2019-12-29 MED ORDER — BUPIVACAINE-EPINEPHRINE 0.25% -1:200000 IJ SOLN
INTRAMUSCULAR | Status: DC | PRN
  Administered 2019-12-29: 30 mL

## 2019-12-29 MED ORDER — LACTATED RINGERS IV SOLN: INTRAVENOUS | Status: DC

## 2019-12-29 MED ORDER — LIDOCAINE 2% (20 MG/ML) 5 ML SYRINGE
INTRAMUSCULAR | Status: DC | PRN
  Administered 2019-12-29: 60 mg via INTRAVENOUS

## 2019-12-29 MED ORDER — INDOCYANINE GREEN 25 MG IV SOLR
2.5000 mg | Freq: Once | INTRAVENOUS | Status: DC
  Filled 2019-12-29: qty 10

## 2019-12-29 MED ORDER — FENTANYL CITRATE (PF) 100 MCG/2ML IJ SOLN
INTRAMUSCULAR | Status: DC | PRN
  Administered 2019-12-29 (×2): 50 ug via INTRAVENOUS
  Administered 2019-12-29: 100 ug via INTRAVENOUS
  Administered 2019-12-29: 50 ug via INTRAVENOUS

## 2019-12-29 MED ORDER — FENTANYL CITRATE (PF) 250 MCG/5ML IJ SOLN
INTRAMUSCULAR | Status: AC
  Filled 2019-12-29: qty 5

## 2019-12-29 MED ORDER — ROCURONIUM BROMIDE 10 MG/ML (PF) SYRINGE
PREFILLED_SYRINGE | INTRAVENOUS | Status: DC | PRN
  Administered 2019-12-29: 80 mg via INTRAVENOUS

## 2019-12-29 MED ORDER — 0.9 % SODIUM CHLORIDE (POUR BTL) OPTIME
TOPICAL | Status: DC | PRN
  Administered 2019-12-29: 1000 mL

## 2019-12-29 MED ORDER — MIDAZOLAM HCL 5 MG/5ML IJ SOLN
INTRAMUSCULAR | Status: DC | PRN
  Administered 2019-12-29: 2 mg via INTRAVENOUS

## 2019-12-29 MED ORDER — HYDROMORPHONE HCL 1 MG/ML IJ SOLN
INTRAMUSCULAR | Status: AC
  Administered 2019-12-29: 0.25 mg via INTRAVENOUS
  Filled 2019-12-29: qty 1

## 2019-12-29 MED ORDER — INDOCYANINE GREEN 25 MG IV SOLR
INTRAVENOUS | Status: DC | PRN
  Administered 2019-12-29: 2.5 mg via INTRAVENOUS

## 2019-12-29 MED ORDER — MIDAZOLAM HCL 2 MG/2ML IJ SOLN
INTRAMUSCULAR | Status: AC
  Filled 2019-12-29: qty 2

## 2019-12-29 MED ORDER — ACETAMINOPHEN 500 MG PO TABS
1000.0000 mg | ORAL_TABLET | ORAL | Status: AC
  Administered 2019-12-29: 1000 mg via ORAL
  Filled 2019-12-29: qty 2

## 2019-12-29 MED ORDER — SODIUM CHLORIDE 0.9 % IV SOLN
2.0000 g | INTRAVENOUS | Status: AC
  Administered 2019-12-29: 2 g via INTRAVENOUS
  Filled 2019-12-29: qty 2

## 2019-12-29 MED ORDER — BUPIVACAINE-EPINEPHRINE (PF) 0.25% -1:200000 IJ SOLN
INTRAMUSCULAR | Status: AC
  Filled 2019-12-29: qty 30

## 2019-12-29 MED ORDER — SUGAMMADEX SODIUM 500 MG/5ML IV SOLN
INTRAVENOUS | Status: DC | PRN
  Administered 2019-12-29: 350 mg via INTRAVENOUS

## 2019-12-29 MED ORDER — KETOROLAC TROMETHAMINE 15 MG/ML IJ SOLN
INTRAMUSCULAR | Status: DC | PRN
  Administered 2019-12-29: 15 mg via INTRAVENOUS

## 2019-12-29 MED ORDER — PROPOFOL 10 MG/ML IV BOLUS
INTRAVENOUS | Status: DC | PRN
  Administered 2019-12-29: 140 mg via INTRAVENOUS

## 2019-12-29 MED ORDER — ORAL CARE MOUTH RINSE: 15.0000 mL | Freq: Once | OROMUCOSAL | Status: AC

## 2019-12-29 MED ORDER — DEXAMETHASONE SODIUM PHOSPHATE 10 MG/ML IJ SOLN
INTRAMUSCULAR | Status: DC | PRN
  Administered 2019-12-29: 10 mg via INTRAVENOUS

## 2019-12-29 MED ORDER — ONDANSETRON HCL 4 MG/2ML IJ SOLN
INTRAMUSCULAR | Status: DC | PRN
  Administered 2019-12-29: 4 mg via INTRAVENOUS

## 2019-12-29 SURGICAL SUPPLY — 58 items
APPLIER CLIP 5 13 M/L LIGAMAX5 (MISCELLANEOUS)
BLADE SURG SZ11 CARB STEEL (BLADE) ×3 IMPLANT
CANNULA REDUC XI 12-8 STAPL (CANNULA) ×2
CANNULA REDUC XI 12-8MM STAPL (CANNULA) ×1
CANNULA REDUCER 12-8 DVNC XI (CANNULA) ×1 IMPLANT
CHLORAPREP W/TINT 26 (MISCELLANEOUS) ×3 IMPLANT
CLIP APPLIE 5 13 M/L LIGAMAX5 (MISCELLANEOUS) IMPLANT
CLIP VESOLOCK LG 6/CT PURPLE (CLIP) ×6 IMPLANT
CLOSURE WOUND 1/2 X4 (GAUZE/BANDAGES/DRESSINGS) ×1
COVER TIP SHEARS 8 DVNC (MISCELLANEOUS) ×1 IMPLANT
COVER TIP SHEARS 8MM DA VINCI (MISCELLANEOUS) ×3
COVER WAND RF STERILE (DRAPES) ×3 IMPLANT
DECANTER SPIKE VIAL GLASS SM (MISCELLANEOUS) ×3 IMPLANT
DRAPE ARM DVNC X/XI (DISPOSABLE) ×4 IMPLANT
DRAPE COLUMN DVNC XI (DISPOSABLE) ×1 IMPLANT
DRAPE DA VINCI XI ARM (DISPOSABLE) ×12
DRAPE DA VINCI XI COLUMN (DISPOSABLE) ×3
DRSG TEGADERM 2-3/8X2-3/4 SM (GAUZE/BANDAGES/DRESSINGS) ×9 IMPLANT
DRSG TEGADERM 4X4.75 (GAUZE/BANDAGES/DRESSINGS) ×3 IMPLANT
ELECT REM PT RETURN 15FT ADLT (MISCELLANEOUS) ×3 IMPLANT
GAUZE SPONGE 2X2 8PLY STRL LF (GAUZE/BANDAGES/DRESSINGS) ×1 IMPLANT
GLOVE BIO SURGEON STRL SZ7.5 (GLOVE) ×6 IMPLANT
GLOVE INDICATOR 8.0 STRL GRN (GLOVE) ×6 IMPLANT
GOWN STRL REUS W/TWL XL LVL3 (GOWN DISPOSABLE) ×6 IMPLANT
GRASPER SUT TROCAR 14GX15 (MISCELLANEOUS) ×3 IMPLANT
IRRIGATOR SUCT 8 DISP DVNC XI (IRRIGATION / IRRIGATOR) IMPLANT
IRRIGATOR SUCTION 8MM XI DISP (IRRIGATION / IRRIGATOR)
KIT BASIN OR (CUSTOM PROCEDURE TRAY) ×3 IMPLANT
KIT TURNOVER KIT A (KITS) ×3 IMPLANT
MANIFOLD NEPTUNE II (INSTRUMENTS) ×3 IMPLANT
NEEDLE HYPO 22GX1.5 SAFETY (NEEDLE) ×3 IMPLANT
NEEDLE INSUFFLATION 14GA 120MM (NEEDLE) IMPLANT
PACK CARDIOVASCULAR III (CUSTOM PROCEDURE TRAY) ×3 IMPLANT
POUCH RETRIEVAL ECOSAC 10 (ENDOMECHANICALS) IMPLANT
POUCH RETRIEVAL ECOSAC 10MM (ENDOMECHANICALS)
SEAL CANN UNIV 5-8 DVNC XI (MISCELLANEOUS) ×2 IMPLANT
SEAL XI 5MM-8MM UNIVERSAL (MISCELLANEOUS) ×6
SET IRRIG TUBING LAPAROSCOPIC (IRRIGATION / IRRIGATOR) ×3 IMPLANT
SOL ANTI FOG 6CC (MISCELLANEOUS) ×1 IMPLANT
SOLUTION ANTI FOG 6CC (MISCELLANEOUS) ×2
SOLUTION ELECTROLUBE (MISCELLANEOUS) ×3 IMPLANT
SPONGE GAUZE 2X2 STER 10/PKG (GAUZE/BANDAGES/DRESSINGS) ×2
SPONGE LAP 18X18 RF (DISPOSABLE) ×3 IMPLANT
STAPLER CANNULA SEAL DVNC XI (STAPLE) ×1 IMPLANT
STAPLER CANNULA SEAL XI (STAPLE) ×3
STRIP CLOSURE SKIN 1/2X4 (GAUZE/BANDAGES/DRESSINGS) ×2 IMPLANT
SUT MNCRL AB 4-0 PS2 18 (SUTURE) ×3 IMPLANT
SUT VIC AB 0 UR5 27 (SUTURE) ×3 IMPLANT
SUT VICRYL 0 TIES 12 18 (SUTURE) IMPLANT
SUT VICRYL 0 UR6 27IN ABS (SUTURE) IMPLANT
SYR 10ML ECCENTRIC (SYRINGE) ×3 IMPLANT
SYR 20ML LL LF (SYRINGE) ×3 IMPLANT
SYS RETRIEVAL 5MM INZII UNIV (BASKET) ×3
SYSTEM RETRIEVL 5MM INZII UNIV (BASKET) ×1 IMPLANT
TOWEL OR 17X26 10 PK STRL BLUE (TOWEL DISPOSABLE) ×3 IMPLANT
TOWEL OR NON WOVEN STRL DISP B (DISPOSABLE) ×3 IMPLANT
TROCAR BLADELESS OPT 5 100 (ENDOMECHANICALS) ×3 IMPLANT
TUBING INSUFFLATION 10FT LAP (TUBING) ×3 IMPLANT

## 2019-12-29 NOTE — H&P (Signed)
CC: here for surgery  Requesting provider: n/a  HPI: [name] is an 74 y.o. female who is here for robotic/laparoscopic cholecystectomy.  sHe was last seen in the clinic on November 5.  She had been seeing me for colicky right upper quadrant pain.  She had an ultrasound which revealed no evidence of cholelithiasis but questionable nodularity along her liver edge but no history of drinking.  She then had a nuclear medicine scan of her gallbladder which revealed normal ejection fraction.  She was still having intermittent flares of right upper quadrant pain radiating to her side and right shoulder associated with bloating.  We discussed referral to gastroenterology versus cholecystectomy.  Her symptoms still seem very typical for biliary colic.  She uses NSAIDs infrequently.  Her daughter is on the phone during both these consultations.  After her visit her and her daughter continued to talk about her symptoms and neck steps.  They call back in the office and decided to proceed with cholecystectomy.  Past Medical History:  Diagnosis Date  . Allergy    Hay fever/bee stings  . Anemia   . Anxiety   . Arthritis   . Asthma   . Atypical nevi 12/02/2005   MID UPPER BACK SLIGHT TO MODERATE  . Atypical nevi 10/31/2005   UPPER LEFT BACK MILD  . Atypical nevi 10/31/2005   UPPER MID BACK MODERATE MARKED TX EXC  . Atypical nevi 10/31/2005   LOWER MID BACK SLIGHT  . Atypical nevi 10/31/2005   RIGHT UPPER ARM SLIGHT  . Atypical nevi 07/07/2008   RIGHT FOREARM MILD  . Atypical nevi 01/05/2009   LEFT INNER SHIN SLIGHT /MODERATE  . Atypical nevi 01/13/2009   LEFT WRIST SLIGHT MODERATE  . Atypical nevi 04/05/2011   UPPER BACK MILD/MOD.  Marland Kitchen Atypical nevi 04/05/2011   LEFT UPPER ARM MOD.  Marland Kitchen Atypical nevi 04/05/2011   LEFT OUTER THIGH MILD  . Atypical nevi 04/05/2011   LEFT BUTTOCK MILD  . Atypical nevi 04/05/2011   RIGHT SHIN MODERATE  . Atypical nevi 10/06/2012   LEFT LEG MILD  .  Atypical nevi 10/06/2012   RIGHT UPPER ARM MILD  . Atypical nevi 10/06/2012   UPPER MID BACK MILD  . Atypical nevi 10/06/2012   RIGHT LOWER BACK MILD  . Atypical nevi 10/06/2012   BACK OF LEFT LEG MODERATE  . Atypical nevi 03/11/2013   LEFT OUTER BREAST MILD  . Atypical nevi 03/11/2013   RIGHT POST KNEE MILD  . Atypical nevi 03/11/2013   LOWER RIGHT SHOULDER MILD  . Atypical nevi 03/11/2013   RIGHT CHEEK MODERATE TX WIDER SHAVE X2  . Atypical nevi 09/08/2014   RIGHT LATERAL THIGH MILD  . Atypical nevi 09/08/2014   RIGHT POST SHOULDER MILD  . Atypical nevi 12/26/2016   LEFT TRAPEZIUS MILD  . Atypical nevus 12/26/2016   LEFT THIGH MILD  . Atypical nevus 06/06/2017   RIGHT FOREARM MILD  . Atypical nevus 10/16/2018   LEFT MID BACK MOD.  Marland Kitchen Atypical nevus 10/16/2018   LEFT ARM MODERATE TX WIDER SHAVE FREE MARGIN  . Atypical nevus 10/16/2018   RIGHT UPPER ARM MILD  . Atypical nevus 10/16/2018   RIGHT ELBOW MODERATE  . Blood transfusion without reported diagnosis 2012  . Cataract   . Depression   . GERD (gastroesophageal reflux disease)   . Heart murmur   . History of kidney stones   . Hypertension   . Melanoma ([place]) 12/02/2004   LEFT TRAPEZIUS  TX EXC  . Pneumonia    hx of     Past Surgical History:  Procedure Laterality Date  . ABDOMINAL HYSTERECTOMY  1986  . MELANOMA EXCISION  2009   left shoulder near neck  . REPLACEMENT TOTAL KNEE BILATERAL  05/2010 &05/2010    History reviewed. No pertinent family history.  Social:  reports that she has never smoked. She has never used smokeless tobacco. She reports that she does not drink alcohol and does not use drugs.  Allergies:  Allergies  Allergen Reactions  . Wasp Venom Protein Anaphylaxis  . Other     Pt allergic to any kind of bee, wasp, jellow jacket, etc    Medications: I have reviewed the patient's current medications.   ROS - all of the below systems have been reviewed with the patient and positives are  indicated with bold text General: chills, fever or night sweats Eyes: blurry vision or double vision ENT: epistaxis or sore throat Allergy/Immunology: itchy/watery eyes or nasal congestion Hematologic/Lymphatic: bleeding problems, blood clots or swollen lymph nodes Endocrine: temperature intolerance or unexpected weight changes Breast: new or changing breast lumps or nipple discharge Resp: cough, shortness of breath, or wheezing CV: chest pain or dyspnea on exertion GI: as per HPI GU: dysuria, trouble voiding, or hematuria MSK: joint pain or joint stiffness Neuro: TIA or stroke symptoms Derm: pruritus and skin lesion changes Psych: anxiety and depression  PE Blood pressure (!) 157/72, pulse 65, temperature 98.3 F (36.8 C), temperature source Oral, resp. rate 18, height 5\' 3"  (1.6 m), weight 83.6 kg, SpO2 99 %. Constitutional: NAD; conversant; no deformities Eyes: Moist conjunctiva; no lid lag; anicteric; PERRL Neck: Trachea midline; no thyromegaly Lungs: Normal respiratory effort; no tactile fremitus CV: RRR; no palpable thrills; no pitting edema GI: Abd soft,nt, nd; no palpable hepatosplenomegaly MSK: Normal gait; no clubbing/cyanosis Psychiatric: Appropriate affect; alert and oriented x3 Lymphatic: No palpable cervical or axillary lymphadenopathy Skin:no rashes/lesions  No results found for this or any previous visit (from the past 41 hour(s)).  No results found.  Imaging: reviewed  A/P: [name] is an 74 y.o. female with  Colicky right upper quadrant pain radiating to her right side and shoulder  For robotic cholecystectomy Enhanced recovery protocol We had previously discussed cholecystectomy in detail.  We also discussed expected outcome in the setting of negative imaging in the sense of one third have complete resolution, one third  have some resolution &one third have ongoing symptoms.  Her and her daughter decided to proceed with surgery. We discussed  using the surgical robot for her procedure today. IV antibiotic She is planning to have cataract surgery next week otherwise no changes to her health since I saw her in the clinic on November 5 All questions asked and answered   Leighton Ruff. Redmond Pulling, MD, FACS General, Bariatric, & Minimally Invasive Surgery [facility], [place]

## 2019-12-29 NOTE — Op Note (Signed)
Robotic/Xi Cholecystectomy with Firefly immunofluorescence procedure Note   Indications: This patient presents with symptomatic gallbladder disease and will undergo laparoscopic cholecystectomy.  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 The risks, benefits, complications, treatment options, and expected outcomes were discussed with the patient. The possibilities of reaction to medication, pulmonary aspiration, perforation of viscus, bleeding, recurrent infection, finding a normal gallbladder, the need for additional procedures, failure to diagnose a condition, the possible need to convert to an open procedure, and creating a complication requiring transfusion or operation were discussed with the patient. The likelihood of improving the patient's symptoms with return to their baseline status is good.  The patient and/or family concurred with the proposed plan, giving informed consent. The site of surgery properly noted. The patient was taken to Operating Room, identified as [name] and the procedure verified as robotic Cholecystectomy. A Time Out was held and the above information confirmed. Antibiotic prophylaxis was administered.    Prior to the induction of general anesthesia, antibiotic prophylaxis was administered. General endotracheal anesthesia was then administered and tolerated well. After the induction, the abdomen was prepped with Chloraprep and draped in the sterile fashion. The patient was positioned in the supine position.   Local anesthetic agent was injected into the skin near the umbilicus and an incision made. We dissected down to the abdominal fascia with blunt dissection.  The fascia was incised  vertically and we entered the peritoneal cavity bluntly.  A pursestring suture of 0-Vicryl was placed around the fascial opening.  A 60mm robotic Hasson cannula was inserted and secured with the stay suture.  Pneumoperitoneum was then created with CO2 and tolerated well without any adverse changes in the patient's vital signs.  The robotic camera was inserted and the abdominal cavity was surveilled.  There is no evidence of injury to surrounding structures.  I then placed two additional 8 mm robotic trochars in a horizontal line in the lower abdomen.  one trochars were placed on the right side of the abdomen and one in the left lower quadrant all under direct visualization. The assistant placed a right lateral 12mm trocar.   I then infiltrated local along the lateral abdominal walls as a tap block.     We positioned the patient in reverse Trendelenburg, tilted slightly to the patient's left.    We then brought in the [employer] I robot and deployed it for upper abdominal surgery from patient's left.  The robotic camera was inserted and the anatomy was targeted.  The robotic arms were attached to the robotic trochars.  A forced bipolar and robotic hook were inserted under direct visualization.   I then scrubbed out and went to the robotic console while the scrub technician stayed at the bedside.    The gallbladder was identified, the fundus grasped and retracted cephalad by the assistent. Adhesions were lysed bluntly and with the electrocautery where indicated, taking care not to injure any adjacent organs or viscus. The infundibulum was grasped and retracted laterally, exposing the peritoneum overlying the triangle of Calot. This was then divided and exposed in a blunt fashion.  Firefly immunofluorescent was turned on several times during the procedure as a secondary confirmation of the location of the cystic duct and common bile duct.  There was no obvious filling defect in the cystic duct or common bile  duct. She had a prominent common bile duct.  A critical view of the cystic duct and cystic artery was obtained.  The cystic duct was clearly identified and bluntly dissected circumferentially.  My assistant performed instrument exchanges between the hook and the robotic hemoclips   The cystic duct was then ligated with 2 clips proximally and 1 distally and divided. The cystic artery  which had been identified & dissected free was ligated with a clip and divided distally with hook cautery as well.    The gallbladder was dissected from the liver bed in retrograde fashion with the electrocautery.  There was some spillage of bile from the gallbladder.  There was no stone that spilled.  The liver bed was irrigated and inspected. Hemostasis was achieved with the electrocautery. Copious irrigation was utilized and was repeatedly aspirated until clear. There was no other evidence of spilled gallstones. The robot was undocked.  I scrubbed back in.  The Endo Catch bag was inserted & the gallbladder was removed and placed in an Endo Catch bag.The gallbladder and Endo Catch bag were then removed through the umbilical port site.      the pursestring suture was used to close the umbilical fascia.  I did place an additional two interrupted 0 Vicryl at the umbilical fascia using a PMI suture passer   We again inspected the right upper quadrant for hemostasis.  The umbilical closure was inspected and there was no air leak and nothing trapped within the closure. Pneumoperitoneum was released as we removed the trocars.  4-0 Monocryl was used to close the skin.   steri-strips, and clean dressings were applied. The patient was then extubated and brought to the recovery room in stable condition. Instrument, sponge, and needle counts were correct at closure and at the conclusion of the case.    Findings: +critical view    Estimated Blood Loss: Minimal         Drains: none         Specimens: Gallbladder            Complications: None; patient tolerated the procedure well.         Disposition: PACU - hemodynamically stable.         Condition: stable  Leighton Ruff. Redmond Pulling, MD, FACS General, Bariatric, & Minimally Invasive Surgery [facility], [place]

## 2019-12-29 NOTE — Anesthesia Procedure Notes (Signed)
Procedure Name: Intubation Date/Time: 12/29/2019 2:56 PM Performed by: Edras Wilford D, CRNA Pre-anesthesia Checklist: Patient identified, Emergency Drugs available, Suction available and Patient being monitored Patient Re-evaluated:Patient Re-evaluated prior to induction Oxygen Delivery Method: Circle system utilized Preoxygenation: Pre-oxygenation with 100% oxygen Induction Type: IV induction Ventilation: Mask ventilation without difficulty Laryngoscope Size: Mac and 3 Grade View: Grade I Tube type: Oral Tube size: 7.0 mm Number of attempts: 1 Airway Equipment and Method: Stylet Placement Confirmation: ETT inserted through vocal cords under direct vision,  positive ETCO2 and breath sounds checked- equal and bilateral Secured at: 21 cm Tube secured with: Tape Dental Injury: Teeth and Oropharynx as per pre-operative assessment

## 2019-12-29 NOTE — Transfer of Care (Signed)
Immediate Anesthesia Transfer of Care Note  Patient: [name]  Procedure(s) Performed: XI ROBOTIC ASSISTED LAPAROSCOPIC CHOLECYSTECTOMY (N/A Abdomen)  Patient Location: PACU  Anesthesia Type:General  Level of Consciousness: awake, alert  and oriented  Airway & Oxygen Therapy: Patient Spontanous Breathing and Patient connected to face mask oxygen  Post-op Assessment: Report given to RN and Post -op Vital signs reviewed and stable  Post vital signs: Reviewed and stable  Last Vitals:  Vitals Value Taken Time  BP 122/106 12/29/19 1609  Temp    Pulse 80 12/29/19 1610  Resp 22 12/29/19 1610  SpO2 88 % 12/29/19 1610  Vitals shown include unvalidated device data.  Last Pain:  Vitals:   12/29/19 1238  TempSrc:   PainSc: 0-No pain         Complications: No complications documented.

## 2019-12-29 NOTE — Anesthesia Preprocedure Evaluation (Signed)
Anesthesia Evaluation  Patient identified by MRN, date of birth, ID band Patient awake    Reviewed: Allergy & Precautions, NPO status , Patient's Chart, lab work & pertinent test results  Airway Mallampati: II  TM Distance: >3 FB Neck ROM: Full    Dental   Pulmonary    Pulmonary exam normal        Cardiovascular hypertension, Pt. on medications Normal cardiovascular exam     Neuro/Psych Anxiety Depression    GI/Hepatic GERD  Medicated and Controlled,  Endo/Other    Renal/GU      Musculoskeletal   Abdominal   Peds  Hematology   Anesthesia Other Findings   Reproductive/Obstetrics                             Anesthesia Physical Anesthesia Plan  ASA: II  Anesthesia Plan: General   Post-op Pain Management:    Induction: Intravenous  PONV Risk Score and Plan: 3 and Midazolam, Ondansetron and Treatment may vary due to age or medical condition  Airway Management Planned: Oral ETT  Additional Equipment:   Intra-op Plan:   Post-operative Plan: Extubation in OR  Informed Consent: I have reviewed the patients History and Physical, chart, labs and discussed the procedure including the risks, benefits and alternatives for the proposed anesthesia with the patient or authorized representative who has indicated his/her understanding and acceptance.       Plan Discussed with: CRNA and Surgeon  Anesthesia Plan Comments:         Anesthesia Quick Evaluation

## 2019-12-30 NOTE — Anesthesia Postprocedure Evaluation (Addendum)
Anesthesia Post Note  Patient: [name]  Procedure(s) Performed: XI ROBOTIC ASSISTED LAPAROSCOPIC CHOLECYSTECTOMY (N/A Abdomen)     Patient location during evaluation: PACU Anesthesia Type: General Level of consciousness: awake and alert Pain management: pain level controlled Vital Signs Assessment: post-procedure vital signs reviewed and stable Respiratory status: spontaneous breathing, nonlabored ventilation, respiratory function stable and patient connected to nasal cannula oxygen Cardiovascular status: blood pressure returned to baseline and stable Postop Assessment: no apparent nausea or vomiting Anesthetic complications: no   No complications documented.  Last Vitals:  Vitals:   12/29/19 1630 12/29/19 1645  BP: (!) 143/58 (!) 151/73  Pulse: 78 73  Resp: 17 19  Temp:    SpO2: 99% 91%    Last Pain:  Vitals:   12/29/19 1645  TempSrc:   PainSc: 2                  Alyiah Ulloa DAVID

## 2019-12-31 LAB — SURGICAL PATHOLOGY

## 2020-01-05 ENCOUNTER — Ambulatory Visit: Payer: Medicare PPO | Admitting: Nurse Practitioner

## 2020-01-22 ENCOUNTER — Encounter: Payer: Self-pay | Admitting: Physician Assistant

## 2020-01-26 ENCOUNTER — Other Ambulatory Visit: Payer: Medicare PPO

## 2020-01-26 DIAGNOSIS — Z20822 Contact with and (suspected) exposure to covid-19: Secondary | ICD-10-CM

## 2020-01-27 LAB — NOVEL CORONAVIRUS, NAA: SARS-CoV-2, NAA: NOT DETECTED

## 2020-01-27 LAB — SARS-COV-2, NAA 2 DAY TAT

## 2020-01-28 ENCOUNTER — Telehealth: Payer: Self-pay | Admitting: *Deleted

## 2020-01-28 NOTE — Telephone Encounter (Signed)
Pt notified of negative COVID-19 results. Understanding verbalized.   

## 2020-02-12 ENCOUNTER — Ambulatory Visit: Payer: Medicare PPO | Admitting: Physician Assistant

## 2020-02-12 ENCOUNTER — Other Ambulatory Visit (INDEPENDENT_AMBULATORY_CARE_PROVIDER_SITE_OTHER): Payer: Medicare PPO

## 2020-02-12 ENCOUNTER — Encounter: Payer: Self-pay | Admitting: Physician Assistant

## 2020-02-12 VITALS — BP 110/64 | HR 74 | Ht 63.0 in | Wt 180.4 lb

## 2020-02-12 DIAGNOSIS — D649 Anemia, unspecified: Secondary | ICD-10-CM

## 2020-02-12 LAB — HEMOGLOBIN: Hemoglobin: 10.9 g/dL — ABNORMAL LOW (ref 12.0–15.0)

## 2020-02-12 LAB — IBC PANEL
Iron: 110 ug/dL (ref 42–145)
Saturation Ratios: 27.3 % (ref 20.0–50.0)
Transferrin: 288 mg/dL (ref 212.0–360.0)

## 2020-02-12 LAB — FERRITIN: Ferritin: 19.7 ng/mL (ref 10.0–291.0)

## 2020-02-12 MED ORDER — NA SULFATE-K SULFATE-MG SULF 17.5-3.13-1.6 GM/177ML PO SOLN: 1.0000 | Freq: Once | ORAL | 0 refills | Status: AC

## 2020-02-12 NOTE — Patient Instructions (Addendum)
If you are age 76 or older, your body mass index should be between 23-30. Your Body mass index is 31.96 kg/m. If this is out of the aforementioned range listed, please consider follow up with your Primary Care Provider.  If you are age 53 or younger, your body mass index should be between 19-25. Your Body mass index is 31.96 kg/m. If this is out of the aformentioned range listed, please consider follow up with your Primary Care Provider.   You have been scheduled for an endoscopy and colonoscopy. Please follow the written instructions given to you at your visit today. Please pick up your prep supplies at the pharmacy within the next 1-3 days. If you use inhalers (even only as needed), please bring them with you on the day of your procedure.  Your provider has requested that you go to the basement level for lab work before leaving today. Press "B" on the  elevator. The lab is located at the first door on the left as you exit the elevator.  Continue Iron Supplements   Due to recent changes in healthcare laws, you may see the results of your imaging and laboratory studies on MyChart before your provider has had a chance to review them.  We understand that in some cases there may be results that are confusing or concerning to you. Not all laboratory results come back in the same time frame and the provider may be waiting for multiple results in order to interpret others.  Please give [place] 48 hours in order for your provider to thoroughly review all the results before contacting the office for clarification of your results.   Thank you for choosing me and [facility].  Ellouise Newer, PA-C

## 2020-02-12 NOTE — Progress Notes (Signed)
Chief Complaint: Anemia  HPI:    [name] is a 75 year old female, previously known to Dr. Deatra Ina, with a past medical history as listed below, who was referred to me by Michael Boston, MD for a complaint of anemia.    12/19/2019 hemoglobin 10.1.    12/29/2019 patient admitted to the hospital for cholecystectomy.    01/07/2020 hemoglobin 10.4, MCV normal 84.3.  CBC otherwise normal.    01/05/2012 colonoscopy with mild diverticulosis in the sigmoid colon otherwise normal.  Repeat recommended in 10 years.    Today, the patient presents to clinic and tells me that she is doing well.  She has been told for years that she is anemic but has never really had a work-up.  Explains that she does have chronic reflux symptoms for which she takes Omeprazole 20 mg daily which works fairly well for her.  Also describes occasional urgent diarrhea which has been going on for years and will hit her a couple of times a month.    Denies fever, chills, blood in her stool, weight loss or symptoms that awaken her from sleep.   Past Medical History:  Diagnosis Date  . Allergy    Hay fever/bee stings  . Anemia   . Anxiety   . Arthritis   . Asthma   . Atypical nevi 12/02/2005   MID UPPER BACK SLIGHT TO MODERATE  . Atypical nevi 10/31/2005   UPPER LEFT BACK MILD  . Atypical nevi 10/31/2005   UPPER MID BACK MODERATE MARKED TX EXC  . Atypical nevi 10/31/2005   LOWER MID BACK SLIGHT  . Atypical nevi 10/31/2005   RIGHT UPPER ARM SLIGHT  . Atypical nevi 07/07/2008   RIGHT FOREARM MILD  . Atypical nevi 01/05/2009   LEFT INNER SHIN SLIGHT /MODERATE  . Atypical nevi 01/13/2009   LEFT WRIST SLIGHT MODERATE  . Atypical nevi 04/05/2011   UPPER BACK MILD/MOD.  Marland Kitchen Atypical nevi 04/05/2011   LEFT UPPER ARM MOD.  Marland Kitchen Atypical nevi 04/05/2011   LEFT OUTER THIGH MILD  . Atypical nevi 04/05/2011   LEFT BUTTOCK MILD  . Atypical nevi 04/05/2011   RIGHT SHIN MODERATE  . Atypical nevi 10/06/2012   LEFT LEG MILD  .  Atypical nevi 10/06/2012   RIGHT UPPER ARM MILD  . Atypical nevi 10/06/2012   UPPER MID BACK MILD  . Atypical nevi 10/06/2012   RIGHT LOWER BACK MILD  . Atypical nevi 10/06/2012   BACK OF LEFT LEG MODERATE  . Atypical nevi 03/11/2013   LEFT OUTER BREAST MILD  . Atypical nevi 03/11/2013   RIGHT POST KNEE MILD  . Atypical nevi 03/11/2013   LOWER RIGHT SHOULDER MILD  . Atypical nevi 03/11/2013   RIGHT CHEEK MODERATE TX WIDER SHAVE X2  . Atypical nevi 09/08/2014   RIGHT LATERAL THIGH MILD  . Atypical nevi 09/08/2014   RIGHT POST SHOULDER MILD  . Atypical nevi 12/26/2016   LEFT TRAPEZIUS MILD  . Atypical nevus 12/26/2016   LEFT THIGH MILD  . Atypical nevus 06/06/2017   RIGHT FOREARM MILD  . Atypical nevus 10/16/2018   LEFT MID BACK MOD.  Marland Kitchen Atypical nevus 10/16/2018   LEFT ARM MODERATE TX WIDER SHAVE FREE MARGIN  . Atypical nevus 10/16/2018   RIGHT UPPER ARM MILD  . Atypical nevus 10/16/2018   RIGHT ELBOW MODERATE  . Blood transfusion without reported diagnosis 2012  . Cataract   . Depression   . GERD (gastroesophageal reflux disease)   . Heart murmur   .  History of kidney stones   . Hypertension   . Melanoma ([place]) 12/02/2004   LEFT TRAPEZIUS TX EXC  . Pneumonia    hx of     Past Surgical History:  Procedure Laterality Date  . ABDOMINAL HYSTERECTOMY  1986  . MELANOMA EXCISION  2009   left shoulder near neck  . REPLACEMENT TOTAL KNEE BILATERAL  05/2010 &05/2010    Current Outpatient Medications  Medication Sig Dispense Refill  . albuterol (PROVENTIL HFA;VENTOLIN HFA) 108 (90 BASE) MCG/ACT inhaler Inhale 2 puffs into the lungs every 6 (six) hours as needed for wheezing or shortness of breath.     Marland Kitchen alendronate (FOSAMAX) 70 MG tablet Take 70 mg by mouth every Sunday. Take with a full glass of water on an empty stomach.    . Biotin w/ Vitamins C & E (HAIR/SKIN/NAILS PO) Take 2 tablets by mouth daily.    . Calcium Carb-Cholecalciferol (CALCIUM 600 + D PO) Take 1 tablet  by mouth 2 (two) times daily.    . clobetasol cream (TEMOVATE) 3.22 % Apply 1 application topically daily. psoriasis     . clonazePAM (KLONOPIN) 0.5 MG tablet Take 0.5 mg by mouth 2 (two) times daily as needed for anxiety.     . diclofenac sodium (VOLTAREN) 1 % GEL Apply 1 application topically 4 (four) times daily as needed (pain).     Marland Kitchen doxazosin (CARDURA) 8 MG tablet Take 8 mg by mouth daily.     Marland Kitchen EPINEPHrine (EPIPEN 2-PAK) 0.3 mg/0.3 mL IJ SOAJ injection Inject 0.6 mg into the muscle as needed for anaphylaxis.    . fexofenadine (ALLEGRA) 180 MG tablet Take 180 mg by mouth daily.    . fluconazole (DIFLUCAN) 10 MG/ML suspension Place 20 mLs (200 mg total) into feeding tube as directed. For fungal infection of nails 35 mL 6  . fluconazole (DIFLUCAN) 40 MG/ML suspension Apply to nails once daily    . FLUoxetine (PROZAC) 40 MG capsule Take 40 mg by mouth Daily.     . furosemide (LASIX) 40 MG tablet Take 40 mg by mouth daily.   0  . ibuprofen (ADVIL) 200 MG tablet Take 400 mg by mouth 2 (two) times daily as needed for moderate pain.    Marland Kitchen L-Theanine 100 MG CAPS Take 100 mg by mouth at bedtime as needed (sleep).    . losartan (COZAAR) 100 MG tablet Take 100 mg by mouth Daily.     . Melatonin 10 MG TABS Take 10 mg by mouth at bedtime as needed (sleep).    . Omeprazole 20 MG TBEC Take 20 mg by mouth Daily.     Vladimir Faster Glycol-Propyl Glycol (SYSTANE OP) Place 1 drop into both eyes daily as needed (dry eyes).    . salmeterol (SEREVENT) 50 MCG/DOSE diskus inhaler Inhale 1 puff into the lungs 2 (two) times daily as needed (shortness of breath).    . simethicone (MYLICON) 025 MG chewable tablet Chew 250 mg by mouth every 6 (six) hours as needed for flatulence.    . traMADol-acetaminophen (ULTRACET) 37.5-325 MG tablet Take 1 tablet by mouth 3 (three) times daily as needed for moderate pain. 12 tablet 0  . traZODone (DESYREL) 100 MG tablet Take 100 mg by mouth at bedtime as needed for sleep.     No  current facility-administered medications for this visit.    Allergies as of 02/12/2020 - Review Complete 12/29/2019  Allergen Reaction Noted  . Wasp venom protein Anaphylaxis 12/21/2018  . Other  12/19/2019    No family history on file.  Social History   Socioeconomic History  . Marital status: Single    Spouse name: Not on file  . Number of children: Not on file  . Years of education: Not on file  . Highest education level: Not on file  Occupational History  . Not on file  Tobacco Use  . Smoking status: Never Smoker  . Smokeless tobacco: Never Used  Vaping Use  . Vaping Use: Never used  Substance and Sexual Activity  . Alcohol use: No  . Drug use: No  . Sexual activity: Not on file  Other Topics Concern  . Not on file  Social History Narrative  . Not on file   Social Determinants of Health   Financial Resource Strain: Not on file  Food Insecurity: Not on file  Transportation Needs: Not on file  Physical Activity: Not on file  Stress: Not on file  Social Connections: Not on file  Intimate Partner Violence: Not on file    Review of Systems:    Constitutional: No weight loss, fever or chills Skin: No rash  Cardiovascular: No chest pain Respiratory: No SOB Gastrointestinal: See HPI and otherwise negative Genitourinary: No dysuria  Neurological: No headache, dizziness or syncope Musculoskeletal: No new muscle or joint pain Hematologic: No bleeding or bruising Psychiatric: No history of depression or anxiety   Physical Exam:  Vital signs: BP 110/64   Pulse 74   Ht _0  (1.6 m)   Wt 180 lb 6.4 oz (81.8 kg)   SpO2 97%   BMI 31.96 kg/m   Constitutional:   Pleasant Elderly Caucasian female appears to be in NAD, Well developed, Well nourished, alert and cooperative Head:  Normocephalic and atraumatic. Eyes:   PEERL, EOMI. No icterus. Conjunctiva pink. Ears:  Normal auditory acuity. Neck:  Supple Throat: Oral cavity and pharynx without inflammation,  swelling or lesion.  Respiratory: Respirations even and unlabored. Lungs clear to auscultation bilaterally.   No wheezes, crackles, or rhonchi.  Cardiovascular: Normal S1, S2. No MRG. Regular rate and rhythm. No peripheral edema, cyanosis or pallor.  Gastrointestinal:  Soft, nondistended, nontender. No rebound or guarding. Normal bowel sounds. No appreciable masses or hepatomegaly. Rectal:  Not performed.  Msk:  Symmetrical without gross deformities. Without edema, no deformity or joint abnormality.  Neurologic:  Alert and  oriented x4;  grossly normal neurologically.  Skin:   Dry and intact without significant lesions or rashes. Psychiatric: Demonstrates good judgement and reason without abnormal affect or behaviors.  RELEVANT LABS AND IMAGING: CBC    Component Value Date/Time   WBC 7.4 12/19/2019 1052   RBC 3.69 (L) 12/19/2019 1052   HGB 10.1 (L) 12/19/2019 1052   HCT 32.4 (L) 12/19/2019 1052   PLT 295 12/19/2019 1052   MCV 87.8 12/19/2019 1052   MCH 27.4 12/19/2019 1052   MCHC 31.2 12/19/2019 1052   RDW 14.8 12/19/2019 1052   LYMPHSABS 2.1 12/19/2019 1052   MONOABS 0.6 12/19/2019 1052   EOSABS 0.3 12/19/2019 1052   BASOSABS 0.0 12/19/2019 1052    CMP     Component Value Date/Time   NA 142 12/19/2019 1052   K 4.2 12/19/2019 1052   CL 109 12/19/2019 1052   CO2 25 12/19/2019 1052   GLUCOSE 104 (H) 12/19/2019 1052   BUN 11 12/19/2019 1052   CREATININE 0.91 12/19/2019 1052   CALCIUM 8.5 (L) 12/19/2019 1052   PROT 6.8 12/19/2019 1052   ALBUMIN 3.6 12/19/2019  1052   AST 19 12/19/2019 1052   ALT 13 12/19/2019 1052   ALKPHOS 57 12/19/2019 1052   BILITOT 0.7 12/19/2019 1052   GFRNONAA >60 12/19/2019 1052   GFRAA  06/25/2010 0430    >60        The eGFR has been calculated using the MDRD equation. This calculation has not been validated in all clinical situations. eGFR's persistently <60 mL/min signify possible Chronic Kidney Disease.    Assessment: 1.  Anemia:  Chronic for the patient, but never been evaluated 2.  GERD: Chronic and moderately controlled on Omeprazole 20 mg daily 3.  Occasional diarrhea: 1-2 episodes a month which are urgent and sometimes result in incontinence; consider IBS versus other  Plan: 1.  Scheduled the patient for diagnostic EGD and colonoscopy in the [facility].  These are scheduled with Dr. Rush Landmark.  Patient was provided with a detailed list of risks for the procedure and she agrees to proceed.  She has had her COVID vaccines. 2.  Ordered iron studies and repeat hemoglobin today 3.  Patient to continue iron supplementation for now. 4.  Patient to follow in clinic per recommendations for Dr. Rush Landmark after time of procedures.  Ellouise Newer, PA-C [facility] 02/12/2020, 1:35 PM  Cc: Michael Boston, MD

## 2020-02-14 NOTE — Progress Notes (Signed)
Attending Physician's Attestation   I have reviewed the chart.   I agree with the Advanced Practitioner's note, impression, and recommendations with any updates as below.    Gabriel Mansouraty, MD [facility] Advanced Endoscopy Office # [phone]  

## 2020-03-16 ENCOUNTER — Ambulatory Visit: Payer: Medicare PPO | Admitting: Physician Assistant

## 2020-03-25 ENCOUNTER — Ambulatory Visit (AMBULATORY_SURGERY_CENTER): Payer: Medicare PPO | Admitting: Gastroenterology

## 2020-03-25 ENCOUNTER — Other Ambulatory Visit: Payer: Self-pay

## 2020-03-25 ENCOUNTER — Encounter: Payer: Self-pay | Admitting: Gastroenterology

## 2020-03-25 VITALS — BP 146/65 | HR 61 | Temp 97.8°F | Resp 52 | Ht 63.0 in | Wt 180.0 lb

## 2020-03-25 DIAGNOSIS — K2970 Gastritis, unspecified, without bleeding: Secondary | ICD-10-CM | POA: Diagnosis not present

## 2020-03-25 DIAGNOSIS — D122 Benign neoplasm of ascending colon: Secondary | ICD-10-CM | POA: Diagnosis not present

## 2020-03-25 DIAGNOSIS — K5730 Diverticulosis of large intestine without perforation or abscess without bleeding: Secondary | ICD-10-CM | POA: Diagnosis not present

## 2020-03-25 DIAGNOSIS — D124 Benign neoplasm of descending colon: Secondary | ICD-10-CM

## 2020-03-25 DIAGNOSIS — K449 Diaphragmatic hernia without obstruction or gangrene: Secondary | ICD-10-CM

## 2020-03-25 DIAGNOSIS — D123 Benign neoplasm of transverse colon: Secondary | ICD-10-CM | POA: Diagnosis not present

## 2020-03-25 DIAGNOSIS — K2950 Unspecified chronic gastritis without bleeding: Secondary | ICD-10-CM | POA: Diagnosis not present

## 2020-03-25 DIAGNOSIS — D649 Anemia, unspecified: Secondary | ICD-10-CM

## 2020-03-25 DIAGNOSIS — K641 Second degree hemorrhoids: Secondary | ICD-10-CM

## 2020-03-25 MED ORDER — SODIUM CHLORIDE 0.9 % IV SOLN: 500.0000 mL | Freq: Once | INTRAVENOUS | Status: DC

## 2020-03-25 MED ORDER — CALCIUM POLYCARBOPHIL 625 MG PO TABS: 625.0000 mg | ORAL_TABLET | Freq: Every day | ORAL | Status: DC

## 2020-03-25 NOTE — Op Note (Signed)
[name] Patient Name: [name] Procedure Date: 03/25/2020 2:03 PM MRN: [id number] Endoscopist: Justice Britain , MD Age: 75 Referring MD:  Date of Birth: 16-Oct-1945 Gender: Female Account #: [id number] Procedure:                Upper GI endoscopy Indications:              Anemia on Iron supplementation not Iron deficient                            currently Medicines:                Monitored Anesthesia Care Procedure:                Pre-Anesthesia Assessment:                           - Prior to the procedure, a History and Physical                            was performed, and patient medications and                            allergies were reviewed. The patient's tolerance of                            previous anesthesia was also reviewed. The risks                            and benefits of the procedure and the sedation                            options and risks were discussed with the patient.                            All questions were answered, and informed consent                            was obtained. Prior Anticoagulants: The patient has                            taken no previous anticoagulant or antiplatelet                            agents except for NSAID medication. ASA Grade                            Assessment: III - A patient with severe systemic                            disease. After reviewing the risks and benefits,                            the patient was deemed in satisfactory condition to  undergo the procedure.                           After obtaining informed consent, the endoscope was                            passed under direct vision. Throughout the                            procedure, the patient's blood pressure, pulse, and                            oxygen saturations were monitored continuously. The                            Endoscope was introduced through the mouth, and                             advanced to the second part of duodenum. The upper                            GI endoscopy was accomplished without difficulty.                            The patient tolerated the procedure. Scope In: Scope Out: Findings:                 No gross lesions were noted in the entire esophagus.                           The Z-line was regular and was found 34 cm from the                            incisors.                           A deformity was found in the proximal stomach -                            likely a hiatal hernia though gastric J-deformity                            could also be playing some role.                           Stripped mild inflammation characterized by                            erythema was found in the gastric body and in the                            gastric antrum. Biopsies were taken with a cold                            forceps  for histology and Helicobacter pylori                            testing.                           No gross lesions were noted in the duodenal bulb,                            in the first portion of the duodenum and in the                            second portion of the duodenum. Biopsies were taken                            with a cold forceps for histology. Complications:            No immediate complications. Estimated Blood Loss:     Estimated blood loss was minimal. Impression:               - No gross lesions in esophagus. Z-line regular, 34                            cm from the incisors.                           - Acquired deformity of proximal stomach - most                            likely hiatal hernia but J-shaped stomach could be                            etiology as well.                           - Gastritis. Biopsied.                           - No gross lesions in the duodenal bulb, in the                            first portion of the duodenum and in the second                             portion of the duodenum. Biopsied. Recommendation:           - Proceed to scheduled colonoscopy.                           - Continue present medications.                           - Await pathology results.                           - Minimize NSAIDs as able.                           -  Recommend non-urgent CXR (PA/Lateral) as well as                            considering an UGI series. She seems to be                            asymptomatic based on initial questioning. Will                            discuss with patient/family to pursue or not.                           - The findings and recommendations were discussed                            with the patient.                           - The findings and recommendations were discussed                            with the patient's family. Justice Britain, MD 03/25/2020 2:56:28 PM

## 2020-03-25 NOTE — Progress Notes (Signed)
1402 Robinul 0.1 mg IV given due large amount of secretions upon assessment.  MD made aware, vss  °

## 2020-03-25 NOTE — Patient Instructions (Signed)
Handouts given:  Gastritis, hemorrhoids, diverticulosis, polyps minimze NSAIDS Start high fiber diet  use fibercon 1-2 tablets daily Continue current medications Await pathology results Repeat colonoscopy in 3 years  YOU HAD AN ENDOSCOPIC PROCEDURE TODAY AT [facility]:   Refer to the procedure report that was given to you for any specific questions about what was found during the examination.  If the procedure report does not answer your questions, please call your gastroenterologist to clarify.  If you requested that your care partner not be given the details of your procedure findings, then the procedure report has been included in a sealed envelope for you to review at your convenience later.  YOU SHOULD EXPECT: Some feelings of bloating in the abdomen. Passage of more gas than usual.  Walking can help get rid of the air that was put into your GI tract during the procedure and reduce the bloating. If you had a lower endoscopy (such as a colonoscopy or flexible sigmoidoscopy) you may notice spotting of blood in your stool or on the toilet paper. If you underwent a bowel prep for your procedure, you may not have a normal bowel movement for a few days.  Please Note:  You might notice some irritation and congestion in your nose or some drainage.  This is from the oxygen used during your procedure.  There is no need for concern and it should clear up in a day or so.  SYMPTOMS TO REPORT IMMEDIATELY:   Following lower endoscopy (colonoscopy or flexible sigmoidoscopy):  Excessive amounts of blood in the stool  Significant tenderness or worsening of abdominal pains  Swelling of the abdomen that is new, acute  Fever of 100F or higher   Following upper endoscopy (EGD)  Vomiting of blood or coffee ground material  New chest pain or pain under the shoulder blades  Painful or persistently difficult swallowing  New shortness of breath  Fever of 100F or higher  Black,  tarry-looking stools  For urgent or emergent issues, a gastroenterologist can be reached at any hour by calling [phone]. Do not use MyChart messaging for urgent concerns.   DIET:  We do recommend a small meal at first, but then you may proceed to your regular diet.  Drink plenty of fluids but you should avoid alcoholic beverages for 24 hours.  ACTIVITY:  You should plan to take it easy for the rest of today and you should NOT DRIVE or use heavy machinery until tomorrow (because of the sedation medicines used during the test).    FOLLOW UP: Our staff will call the number listed on your records 48-72 hours following your procedure to check on you and address any questions or concerns that you may have regarding the information given to you following your procedure. If we do not reach you, we will leave a message.  We will attempt to reach you two times.  During this call, we will ask if you have developed any symptoms of COVID 19. If you develop any symptoms (ie: fever, flu-like symptoms, shortness of breath, cough etc.) before then, please call [phone].  If you test positive for Covid 19 in the 2 weeks post procedure, please call and report this information to [place].    If any biopsies were taken you will be contacted by phone or by letter within the next 1-3 weeks.  Please call us at [phone] if you have not heard about the biopsies in 3 weeks.   SIGNATURES/CONFIDENTIALITY: You  and/or your care partner have signed paperwork which will be entered into your electronic medical record.  These signatures attest to the fact that that the information above on your After Visit Summary has been reviewed and is understood.  Full responsibility of the confidentiality of this discharge information lies with you and/or your care-partner.

## 2020-03-25 NOTE — Op Note (Signed)
[facility] Patient Name: [name] Procedure Date: 03/25/2020 2:03 PM MRN: [id number] Endoscopist: Justice Britain , MD Age: 75 Referring MD:  Date of Birth: 1945/06/12 Gender: Female Account #: [id number] Procedure:                Colonoscopy Indications:              Screening for colorectal malignant neoplasm,                            History of Anemia on Iron supplementationIncidental                            - Iron deficiency anemia Medicines:                Monitored Anesthesia Care Procedure:                Pre-Anesthesia Assessment:                           - Prior to the procedure, a History and Physical                            was performed, and patient medications and                            allergies were reviewed. The patient's tolerance of                            previous anesthesia was also reviewed. The risks                            and benefits of the procedure and the sedation                            options and risks were discussed with the patient.                            All questions were answered, and informed consent                            was obtained. Prior Anticoagulants: The patient has                            taken no previous anticoagulant or antiplatelet                            agents except for NSAID medication. ASA Grade                            Assessment: III - A patient with severe systemic                            disease. After reviewing the risks and benefits,  the patient was deemed in satisfactory condition to                            undergo the procedure.                           After obtaining informed consent, the colonoscope                            was passed under direct vision. Throughout the                            procedure, the patient's blood pressure, pulse, and                            oxygen saturations were monitored continuously.  The                            Olympus PCF-H190DL [id number]) Colonoscope was                            introduced through the anus and advanced to the the                            cecum, identified by appendiceal orifice and                            ileocecal valve. The colonoscopy was performed                            without difficulty. The patient tolerated the                            procedure. The quality of the bowel preparation was                            good. The ileocecal valve, appendiceal orifice, and                            rectum were photographed. Scope In: 2:21:10 PM Scope Out: 2:43:18 PM Scope Withdrawal Time: 0 hours 19 minutes 39 seconds  Total Procedure Duration: 0 hours 22 minutes 8 seconds  Findings:                 The digital rectal exam findings include                            hemorrhoids. Pertinent negatives include no                            palpable rectal lesions.                           Seven sessile and semi-sessile polyps were found in  the descending colon, transverse colon and                            ascending colon. The polyps were 3 to 10 mm in                            size. These polyps were removed with a cold snare.                            Resection and retrieval were complete.                           Multiple small-mouthed diverticula were found in                            the sigmoid colon, descending colon, transverse                            colon and ascending colon.                           Normal mucosa was found in the entire colon                            otherwise.                           Non-bleeding non-thrombosed internal hemorrhoids                            were found during retroflexion, during perianal                            exam and during digital exam. The hemorrhoids were                            Grade II (internal hemorrhoids that prolapse but                             reduce spontaneously). Complications:            No immediate complications. Estimated Blood Loss:     Estimated blood loss was minimal. Impression:               - Hemorrhoids found on digital rectal exam.                           - Seven 3 to 10 mm polyps in the descending colon,                            in the transverse colon and in the ascending colon,                            removed with a cold snare. Resected and retrieved.                           -  Diverticulosis in the sigmoid colon, in the                            descending colon, in the transverse colon and in                            the ascending colon.                           - Normal mucosa in the entire examined colon                            otherwise.                           - Non-bleeding non-thrombosed internal hemorrhoids. Recommendation:           - The patient will be observed post-procedure,                            until all discharge criteria are met.                           - Discharge patient to home.                           - Patient has a contact number available for                            emergencies. The signs and symptoms of potential                            delayed complications were discussed with the                            patient. Return to normal activities tomorrow.                            Written discharge instructions were provided to the                            patient.                           - High fiber diet.                           - Use FiberCon 1-2 tablets PO daily.                           - Continue present medications.                           - Await pathology results.                           - Repeat colonoscopy in likely 3 years for  surveillance based on pathology results - depending                            on patient's age and comorbidities can consider                            this at  that time.                           - If patient develops Iron deficiency in future,                            would consider a VCE, but for now any additional                            workup from an anemia perspective would need                            hematology evaluation.                           - The findings and recommendations were discussed                            with the patient.                           - The findings and recommendations were discussed                            with the patient's family. Justice Britain, MD 03/25/2020 3:04:58 PM

## 2020-03-25 NOTE — Progress Notes (Signed)
Called to room to assist during endoscopic procedure.  Patient ID and intended procedure confirmed with present staff. Received instructions for my participation in the procedure from the performing physician.  

## 2020-03-29 ENCOUNTER — Telehealth: Payer: Self-pay | Admitting: *Deleted

## 2020-03-29 NOTE — Telephone Encounter (Signed)
  Follow up Call-  Call back number 03/25/2020  Post procedure Call Back phone  # [phone]  Permission to leave phone message Yes  Some recent data might be hidden     Patient questions:  Do you have a fever, pain , or abdominal swelling? No. Pain Score  0 *  Have you tolerated food without any problems? Yes.    Have you been able to return to your normal activities? Yes.    Do you have any questions about your discharge instructions: Diet   No. Medications  No. Follow up visit  No.  Do you have questions or concerns about your Care? No.  Actions: * If pain score is 4 or above: No action needed, pain <4.  1. Have you developed a fever since your procedure? no  2.   Have you had an respiratory symptoms (SOB or cough) since your procedure? no  3.   Have you tested positive for COVID 19 since your procedure no  4.   Have you had any family members/close contacts diagnosed with the COVID 19 since your procedure?  no   If yes to any of these questions please route to Joylene John, RN and Joella Prince, RN

## 2020-03-29 NOTE — Telephone Encounter (Signed)
Left message on f/u call 

## 2020-03-30 ENCOUNTER — Ambulatory Visit (INDEPENDENT_AMBULATORY_CARE_PROVIDER_SITE_OTHER)
Admission: RE | Admit: 2020-03-30 | Discharge: 2020-03-30 | Disposition: A | Discharge status: Home / Self Care | Payer: Medicare PPO | Source: Ambulatory Visit | Attending: Gastroenterology | Admitting: Gastroenterology

## 2020-03-30 ENCOUNTER — Other Ambulatory Visit: Payer: Self-pay

## 2020-03-30 DIAGNOSIS — K449 Diaphragmatic hernia without obstruction or gangrene: Secondary | ICD-10-CM | POA: Diagnosis not present

## 2020-04-02 ENCOUNTER — Encounter: Payer: Self-pay | Admitting: Gastroenterology

## 2020-04-02 ENCOUNTER — Other Ambulatory Visit: Payer: Self-pay

## 2020-04-02 DIAGNOSIS — D649 Anemia, unspecified: Secondary | ICD-10-CM

## 2020-04-08 ENCOUNTER — Encounter: Payer: Self-pay | Admitting: Physician Assistant

## 2020-04-08 ENCOUNTER — Other Ambulatory Visit: Payer: Self-pay

## 2020-04-08 ENCOUNTER — Ambulatory Visit: Payer: Medicare PPO | Admitting: Physician Assistant

## 2020-04-08 DIAGNOSIS — D2261 Melanocytic nevi of right upper limb, including shoulder: Secondary | ICD-10-CM

## 2020-04-08 DIAGNOSIS — D485 Neoplasm of uncertain behavior of skin: Secondary | ICD-10-CM

## 2020-04-08 DIAGNOSIS — L918 Other hypertrophic disorders of the skin: Secondary | ICD-10-CM

## 2020-04-08 DIAGNOSIS — Z86018 Personal history of other benign neoplasm: Secondary | ICD-10-CM | POA: Diagnosis not present

## 2020-04-08 DIAGNOSIS — Z1283 Encounter for screening for malignant neoplasm of skin: Secondary | ICD-10-CM | POA: Diagnosis not present

## 2020-04-08 DIAGNOSIS — D229 Melanocytic nevi, unspecified: Secondary | ICD-10-CM

## 2020-04-08 DIAGNOSIS — Z85820 Personal history of malignant melanoma of skin: Secondary | ICD-10-CM

## 2020-04-08 HISTORY — DX: Melanocytic nevi, unspecified: D22.9

## 2020-04-08 MED ORDER — NONFORMULARY OR COMPOUNDED ITEM: 2 refills | Status: AC

## 2020-04-08 MED ORDER — CLOBETASOL PROPIONATE 0.05 % EX CREA: 1.0000 "application " | TOPICAL_CREAM | Freq: Every day | CUTANEOUS | 11 refills | Status: AC

## 2020-04-08 NOTE — Patient Instructions (Signed)

## 2020-04-15 ENCOUNTER — Telehealth: Payer: Self-pay

## 2020-04-15 NOTE — Telephone Encounter (Signed)
-----   Message from Warren Danes, [place] sent at 04/14/2020  4:18 PM EDT ----- Recheck in 6 months

## 2020-04-15 NOTE — Telephone Encounter (Signed)
Phone call to patient with her pathology results. Patient aware of results.  

## 2020-04-26 ENCOUNTER — Encounter: Payer: Self-pay | Admitting: Physician Assistant

## 2020-04-26 NOTE — Progress Notes (Signed)
   Follow-Up Visit   Subjective  [name] is a 75 y.o. female who presents for the following: Annual Exam (Patient here today for 6 month skin check.Per patient she has bumps behind her left ear x couple months she would like removed.).   The following portions of the chart were reviewed this encounter and updated as appropriate:  Tobacco  Allergies  Meds  Problems  Med Hx  Surg Hx  Fam Hx      Objective  Well appearing patient in no apparent distress; mood and affect are within normal limits.  A full examination was performed including scalp, head, eyes, ears, nose, lips, neck, chest, axillae, abdomen, back, buttocks, bilateral upper extremities, bilateral lower extremities, hands, feet, fingers, toes, fingernails, and toenails. All findings within normal limits unless otherwise noted below.  Objective  head to toe: No signs of non-mole skin cancer.   Objective  Left trapezius: White scar- clear  Objective  Left Lower Back: Multiple white scars- clear  Objective  Right Antecubital Fossa: Dark macule  Objective  Neck - Anterior (3): Fleshy, skin-colored sessile and pedunculated papules.    Assessment & Plan  Encounter for screening for malignant neoplasm of skin head to toe  Yearly skin check  Personal history of malignant melanoma of skin Left trapezius  Yearly skin check  History of dysplastic nevus Left Lower Back  Yearly skin check  Neoplasm of uncertain behavior of skin Right Antecubital Fossa  Skin / nail biopsy Type of biopsy: tangential   Informed consent: discussed and consent obtained   Timeout: patient name, date of birth, surgical site, and procedure verified   Procedure prep:  Patient was prepped and draped in usual sterile fashion (Non sterile) Prep type:  Chlorhexidine Anesthesia: the lesion was anesthetized in a standard fashion   Anesthetic:  1% lidocaine w/ epinephrine 1-100,000 local infiltration Instrument used:  flexible razor blade   Outcome: patient tolerated procedure well   Post-procedure details: wound care instructions given    Specimen 1 - Surgical pathology Differential Diagnosis: r/o atypia  Check Margins: No  Inflamed skin tag (3) Neck - Anterior  Destruction of lesion - Neck - Anterior (2) Complexity: simple   Destruction method comment:  Scissors were used to snip tag at the base Informed consent: discussed and consent obtained   Timeout:  patient name, date of birth, surgical site, and procedure verified Anesthesia: the lesion was anesthetized in a standard fashion   Hemostasis achieved with:  pressure Outcome: patient tolerated procedure well with no complications   Post-procedure details: wound care instructions given     I, Olyn Landstrom, PA-C, have reviewed all documentation's for this visit.  The documentation on 04/26/20 for the exam, diagnosis, procedures and orders are all accurate and complete.

## 2020-05-07 ENCOUNTER — Ambulatory Visit: Payer: Medicare PPO | Admitting: Gastroenterology

## 2020-05-07 ENCOUNTER — Encounter: Payer: Self-pay | Admitting: Gastroenterology

## 2020-05-07 ENCOUNTER — Other Ambulatory Visit: Payer: Medicare PPO

## 2020-05-07 ENCOUNTER — Other Ambulatory Visit: Payer: Self-pay

## 2020-05-07 VITALS — BP 150/80 | HR 55 | Ht 62.5 in | Wt 181.5 lb

## 2020-05-07 DIAGNOSIS — K219 Gastro-esophageal reflux disease without esophagitis: Secondary | ICD-10-CM

## 2020-05-07 DIAGNOSIS — K529 Noninfective gastroenteritis and colitis, unspecified: Secondary | ICD-10-CM

## 2020-05-07 DIAGNOSIS — D649 Anemia, unspecified: Secondary | ICD-10-CM

## 2020-05-07 DIAGNOSIS — Z862 Personal history of diseases of the blood and blood-forming organs and certain disorders involving the immune mechanism: Secondary | ICD-10-CM

## 2020-05-07 DIAGNOSIS — K449 Diaphragmatic hernia without obstruction or gangrene: Secondary | ICD-10-CM | POA: Diagnosis not present

## 2020-05-07 DIAGNOSIS — Z86010 Personal history of colonic polyps: Secondary | ICD-10-CM

## 2020-05-07 NOTE — Patient Instructions (Signed)
If you are age 76 or older, your body mass index should be between 23-30. Your Body mass index is 32.67 kg/m. If this is out of the aforementioned range listed, please consider follow up with your Primary Care Provider.  If you are age 76 or younger, your body mass index should be between 19-25. Your Body mass index is 32.67 kg/m. If this is out of the aformentioned range listed, please consider follow up with your Primary Care Provider.   Your provider has requested that you go to the basement level for lab work before leaving today. Press "B" on the elevator. The lab is located at the first door on the left as you exit the elevator.   A high fiber diet with plenty of fluids (up to 8 glasses of water daily) is suggested to relieve these symptoms.  Metamucil, 1 tablespoon once or twice daily can be used to keep bowels regular if needed.  Send mychart message in 6 weeks to let us know how you are doing. May consider starting Cholestyramine at that time.   Thank you for choosing me and [facility].  Dr. Rush Landmark

## 2020-05-07 NOTE — Progress Notes (Signed)
[name] VISIT   Primary Care Provider Michael Boston, [place] [address] [phone]  Patient Profile: [name] is a 75 y.o. female with a pmh significant for hypertension, asthma/COPD, seasonal allergies, anxiety, prior IDA, status post cholecystectomy, GERD, large hiatal hernia, colon polyps (TAs).  The patient presents to the [facility] for an evaluation and management of problem(s) noted below:  Problem List 1. Chronic diarrhea   2. Hiatal hernia   3. Gastroesophageal reflux disease without esophagitis   4. History of anemia   5. Hx of adenomatous colonic polyps     History of Present Illness Please see initial consultation note by PA [facility] for full details of HPI.  Interval History The patient returns for scheduled follow-up.  She describes to me a more significant history of chronic diarrhea.  Based on the prior notation in the chart it was not completely clear that the chronicity of her diarrhea was what she is currently stating has been ongoing for many years.  She will have days of some normal bowel movements but for the most part her stools are looser or semiformed.  No blood in her stools.  Not having significant GERD symptoms or dysphagia.  She is wondering if there is anything else that can be done for her diarrhea.  GI Review of Systems Positive as above Negative for odynophagia, nausea, vomiting, pain, melena, hematochezia  Review of Systems General: Denies fevers/chills/weight loss unintentionally Cardiovascular: Denies chest pain/palpitations Pulmonary: Denies change in status of shortness of breath/cough Gastroenterological: See HPI Genitourinary: Denies darkened urine  Hematological: Denies easy bruising/bleeding Endocrine: Denies temperature intolerance Dermatological: Denies jaundice Psychological: Mood is stable   Medications Current Outpatient Medications  Medication Sig  Dispense Refill  . albuterol (PROVENTIL HFA;VENTOLIN HFA) 108 (90 BASE) MCG/ACT inhaler Inhale 2 puffs into the lungs every 6 (six) hours as needed for wheezing or shortness of breath.     Marland Kitchen alendronate (FOSAMAX) 70 MG tablet Take 70 mg by mouth every Sunday. Take with a full glass of water on an empty stomach.    . Biotin w/ Vitamins C & E (HAIR/SKIN/NAILS PO) Take 2 tablets by mouth daily.    . Calcium Carb-Cholecalciferol (CALCIUM 600 + D PO) Take 1 tablet by mouth 2 (two) times daily.    . clobetasol cream (TEMOVATE) 9.48 % Apply 1 application topically daily. psoriasis 60 g 11  . clonazePAM (KLONOPIN) 0.5 MG tablet Take 0.5 mg by mouth 2 (two) times daily as needed for anxiety.     . diclofenac sodium (VOLTAREN) 1 % GEL Apply 1 application topically 4 (four) times daily as needed (pain).     Marland Kitchen doxazosin (CARDURA) 8 MG tablet Take 8 mg by mouth daily.     Marland Kitchen EPINEPHrine 0.3 mg/0.3 mL IJ SOAJ injection Inject 0.6 mg into the muscle as needed for anaphylaxis.    . fexofenadine (ALLEGRA) 180 MG tablet Take 180 mg by mouth daily.    Marland Kitchen FLUoxetine (PROZAC) 40 MG capsule Take 40 mg by mouth Daily.     . furosemide (LASIX) 40 MG tablet Take 40 mg by mouth daily.   0  . ibuprofen (ADVIL) 200 MG tablet Take 400 mg by mouth 2 (two) times daily as needed for moderate pain.    Marland Kitchen L-Theanine 100 MG CAPS Take 100 mg by mouth at bedtime as needed (sleep).    . losartan (COZAAR) 100 MG tablet Take 100 mg by mouth Daily.     Marland Kitchen  Melatonin 10 MG TABS Take 10 mg by mouth at bedtime as needed (sleep).    . NONFORMULARY OR COMPOUNDED ITEM Fluconazole 200mg  crush tabs in 48ml DMSO # 2oz 1-2 Drops Under The Nail 2 each 2  . Omeprazole 20 MG TBEC Take 20 mg by mouth Daily.     Vladimir Faster Glycol-Propyl Glycol (SYSTANE OP) Place 1 drop into both eyes daily as needed (dry eyes).    . salmeterol (SEREVENT) 50 MCG/DOSE diskus inhaler Inhale 1 puff into the lungs 2 (two) times daily as needed (shortness of breath).    .  traZODone (DESYREL) 100 MG tablet Take 100 mg by mouth at bedtime as needed for sleep.     No current facility-administered medications for this visit.    Allergies Allergies  Allergen Reactions  . Wasp Venom Protein Anaphylaxis  . Other     Pt allergic to any kind of bee, wasp, jellow jacket, etc    Histories Past Medical History:  Diagnosis Date  . Allergy    Hay fever/bee stings  . Anemia   . Anxiety   . Arthritis   . Asthma   . Atypical mole 04/08/2020   Right Antecubital Fossa (moderate)  . Atypical nevi 12/02/2005   MID UPPER BACK SLIGHT TO MODERATE  . Atypical nevi 10/31/2005   UPPER LEFT BACK MILD  . Atypical nevi 10/31/2005   UPPER MID BACK MODERATE MARKED TX EXC  . Atypical nevi 10/31/2005   LOWER MID BACK SLIGHT  . Atypical nevi 10/31/2005   RIGHT UPPER ARM SLIGHT  . Atypical nevi 07/07/2008   RIGHT FOREARM MILD  . Atypical nevi 01/05/2009   LEFT INNER SHIN SLIGHT /MODERATE  . Atypical nevi 01/13/2009   LEFT WRIST SLIGHT MODERATE  . Atypical nevi 04/05/2011   UPPER BACK MILD/MOD.  Marland Kitchen Atypical nevi 04/05/2011   LEFT UPPER ARM MOD.  Marland Kitchen Atypical nevi 04/05/2011   LEFT OUTER THIGH MILD  . Atypical nevi 04/05/2011   LEFT BUTTOCK MILD  . Atypical nevi 04/05/2011   RIGHT SHIN MODERATE  . Atypical nevi 10/06/2012   LEFT LEG MILD  . Atypical nevi 10/06/2012   RIGHT UPPER ARM MILD  . Atypical nevi 10/06/2012   UPPER MID BACK MILD  . Atypical nevi 10/06/2012   RIGHT LOWER BACK MILD  . Atypical nevi 10/06/2012   BACK OF LEFT LEG MODERATE  . Atypical nevi 03/11/2013   LEFT OUTER BREAST MILD  . Atypical nevi 03/11/2013   RIGHT POST KNEE MILD  . Atypical nevi 03/11/2013   LOWER RIGHT SHOULDER MILD  . Atypical nevi 03/11/2013   RIGHT CHEEK MODERATE TX WIDER SHAVE X2  . Atypical nevi 09/08/2014   RIGHT LATERAL THIGH MILD  . Atypical nevi 09/08/2014   RIGHT POST SHOULDER MILD  . Atypical nevi 12/26/2016   LEFT TRAPEZIUS MILD  . Atypical nevus  12/26/2016   LEFT THIGH MILD  . Atypical nevus 06/06/2017   RIGHT FOREARM MILD  . Atypical nevus 10/16/2018   LEFT MID BACK MOD.  Marland Kitchen Atypical nevus 10/16/2018   LEFT ARM MODERATE TX WIDER SHAVE FREE MARGIN  . Atypical nevus 10/16/2018   RIGHT UPPER ARM MILD  . Atypical nevus 10/16/2018   RIGHT ELBOW MODERATE  . Blood transfusion without reported diagnosis 2012  . Cataract   . Depression   . GERD (gastroesophageal reflux disease)   . Heart murmur   . History of kidney stones   . Hypertension   . Melanoma ([place]) 12/02/2004  LEFT TRAPEZIUS TX EXC  . Osteoporosis   . Pneumonia    hx of    Past Surgical History:  Procedure Laterality Date  . ABDOMINAL HYSTERECTOMY  1986  . CATARACT EXTRACTION, BILATERAL  2020  . CHOLECYSTECTOMY  12/2019  . MELANOMA EXCISION  2009   left shoulder near neck  . REPLACEMENT TOTAL KNEE BILATERAL  05/2010 &05/2010   Social History   Socioeconomic History  . Marital status: Single    Spouse name: Not on file  . Number of children: Not on file  . Years of education: Not on file  . Highest education level: Not on file  Occupational History  . Not on file  Tobacco Use  . Smoking status: Never Smoker  . Smokeless tobacco: Never Used  Vaping Use  . Vaping Use: Never used  Substance and Sexual Activity  . Alcohol use: No  . Drug use: No  . Sexual activity: Not Currently  Other Topics Concern  . Not on file  Social History Narrative  . Not on file   Social Determinants of Health   Financial Resource Strain: Not on file  Food Insecurity: Not on file  Transportation Needs: Not on file  Physical Activity: Not on file  Stress: Not on file  Social Connections: Not on file  Intimate Partner Violence: Not on file   Family History  Problem Relation Age of Onset  . Colon polyps Mother   . Kidney cancer Mother   . COPD Mother   . Diverticulitis Father   . Colon polyps Father   . Dysphagia Father   . Heart disease Father   . Diabetes  Father   . Other Sister        Hypoglycemia  . Diabetes Brother   . Diabetes Brother   . Diabetes Brother   . Colon cancer Neg Hx   . Pancreatic cancer Neg Hx   . Stomach cancer Neg Hx   . Liver disease Neg Hx   . Esophageal cancer Neg Hx   . Rectal cancer Neg Hx    I have reviewed her medical, social, and family history in detail and updated the electronic medical record as necessary.    PHYSICAL EXAMINATION  BP (!) 150/80 (BP Location: Left Arm, Patient Position: Sitting, Cuff Size: Normal)   Pulse (!) 55   Ht 5' 2.5" (1.588 m) Comment: height measured without shoes  Wt 181 lb 8 oz (82.3 kg)   BMI 32.67 kg/m  Wt Readings from Last 3 Encounters:  05/07/20 181 lb 8 oz (82.3 kg)  03/25/20 180 lb (81.6 kg)  02/12/20 180 lb 6.4 oz (81.8 kg)  GEN: NAD, appears younger than stated age, doesn't appear chronically ill PSYCH: Cooperative, without pressured speech EYE: Conjunctivae pink, sclerae anicteric ENT: Masked CV: Nontachycardic RESP: No audible wheezing GI: NABS, soft, NT/ND, without rebound or guarding, no HSM appreciated MSK/EXT: Lower extremity edema present bilaterally SKIN: No jaundice NEURO:  Alert & Oriented x 3, no focal deficits   REVIEW OF DATA  I reviewed the following data at the time of this encounter:  GI Procedures and Studies  February 2022 EGD - No gross lesions in esophagus. Z-line regular, 34 cm from the incisors. - Acquired deformity of proximal stomach - most likely hiatal hernia but J-shaped stomach could be etiology as well. - Gastritis. Biopsied. - No gross lesions in the duodenal bulb, in the first portion of the duodenum and in the second portion of the duodenum. Biopsied.  February 2022 Colonoscopy - Hemorrhoids found on digital rectal exam. - Seven 3 to 10 mm polyps in the descending colon, in the transverse colon and in the ascending colon, removed with a cold snare. Resected and retrieved. - Diverticulosis in the sigmoid colon, in the  descending colon, in the transverse colon and in the ascending colon. - Normal mucosa in the entire examined colon otherwise. - Non-bleeding non-thrombosed internal hemorrhoids.  Pathology Diagnosis 1. Surgical [P], duodenal bx - DUODENAL MUCOSA WITH NO SIGNIFICANT PATHOLOGIC FINDINGS. - NEGATIVE FOR INCREASED INTRAEPITHELIAL LYMPHOCYTES AND VILLOUS ARCHITECTURAL CHANGES. 2. Surgical [P], gastric - MILD CHRONIC GASTRITIS. - WARTHIN-STARRY STAIN IS NEGATIVE FOR HELICOBACTER PYLORI 3. Surgical [P], colon, ascending x5, transverse x1, descending x1, polyp (7) - TUBULAR ADENOMA (X MULTIPLE). - NEGATIVE FOR HIGH GRADE DYSPLASIA.  Laboratory Studies  Reviewed those in [facility]  Imaging Studies  3/22 CXR IMPRESSION: No active cardiopulmonary disease. Stable large hiatal hernia.   ASSESSMENT  Ms. Triplett is a 75 y.o. female with a pmh significant for hypertension, asthma/COPD, seasonal allergies, anxiety, prior IDA, status post cholecystectomy, GERD, large hiatal hernia, colon polyps (TAs). The patient is seen today for evaluation and management of:  1. Chronic diarrhea   2. Hiatal hernia   3. Gastroesophageal reflux disease without esophagitis   4. History of anemia   5. Hx of adenomatous colonic polyps    The patient seems to be hemodynamically stable.  Clinically she is also stable although she is having more significant diarrheal symptoms than were initially understood.  At this point further work-up is recommended.  I am going to try to bulk her stools significantly to see if that makes any difference in her symptoms first.  If not we will trial cholestyramine.  If that also does not work and we will out exocrine pancreas insufficiency as well as SIBO then we will need to consider a flexible sigmoidoscopy for biopsies to be obtained to rule out microscopic/collagenous colitis.  We will see how she does over the next 6 weeks and she will MyChart [place] and then let us know about follow-up in  approximately 3 to 4 months or sooner if she is not making headway.  All patient questions were answered to the best of my ability, and the patient agrees to the aforementioned plan of action with follow-up as indicated.   PLAN  Continue current PPI dosing Patient may have laboratories checked to ensure stability of blood counts and iron indices in the course of the coming months Metamucil once to twice daily MyChart in 6 weeks to let us know how she is doing Fecal elastase to be obtained SIBO breath testing will be considered Diagnostic sigmoidoscopy with biopsies may be considered in future Suspect symptoms are more likely IBS however Colonoscopy for surveillance in 2025   Orders Placed This Encounter  Procedures  . Pancreatic elastase, fecal    New Prescriptions   No medications on file   Modified Medications   No medications on file    Planned Follow Up Return in about 4 months (around 09/06/2020).   Total Time in Face-to-Face and in Coordination of Care for patient including independent/personal interpretation/review of prior testing, medical history, examination, medication adjustment, communicating results with the patient directly, and documentation with the EHR is 25 minutes.  Justice Britain, MD [facility] Office # [phone]

## 2020-05-10 ENCOUNTER — Encounter: Payer: Self-pay | Admitting: Gastroenterology

## 2020-05-10 DIAGNOSIS — Z862 Personal history of diseases of the blood and blood-forming organs and certain disorders involving the immune mechanism: Secondary | ICD-10-CM | POA: Insufficient documentation

## 2020-05-10 DIAGNOSIS — K449 Diaphragmatic hernia without obstruction or gangrene: Secondary | ICD-10-CM | POA: Insufficient documentation

## 2020-05-10 DIAGNOSIS — K529 Noninfective gastroenteritis and colitis, unspecified: Secondary | ICD-10-CM | POA: Insufficient documentation

## 2020-05-11 DIAGNOSIS — K219 Gastro-esophageal reflux disease without esophagitis: Secondary | ICD-10-CM | POA: Insufficient documentation

## 2020-05-11 DIAGNOSIS — Z86010 Personal history of colonic polyps: Secondary | ICD-10-CM | POA: Insufficient documentation

## 2020-05-20 ENCOUNTER — Other Ambulatory Visit: Payer: Medicare PPO

## 2020-05-20 DIAGNOSIS — K529 Noninfective gastroenteritis and colitis, unspecified: Secondary | ICD-10-CM

## 2020-05-27 LAB — PANCREATIC ELASTASE, FECAL: Pancreatic Elastase-1, Stool: >500 mcg/g

## 2020-06-21 ENCOUNTER — Emergency Department (HOSPITAL_BASED_OUTPATIENT_CLINIC_OR_DEPARTMENT_OTHER)
Admission: EM | Admit: 2020-06-21 | Discharge: 2020-06-21 | Disposition: A | Discharge status: Home / Self Care | Payer: No Typology Code available for payment source | Attending: Emergency Medicine | Admitting: Emergency Medicine

## 2020-06-21 ENCOUNTER — Emergency Department (HOSPITAL_BASED_OUTPATIENT_CLINIC_OR_DEPARTMENT_OTHER): Payer: No Typology Code available for payment source

## 2020-06-21 ENCOUNTER — Other Ambulatory Visit: Payer: Self-pay

## 2020-06-21 ENCOUNTER — Encounter (HOSPITAL_BASED_OUTPATIENT_CLINIC_OR_DEPARTMENT_OTHER): Payer: Self-pay

## 2020-06-21 DIAGNOSIS — I10 Essential (primary) hypertension: Secondary | ICD-10-CM | POA: Insufficient documentation

## 2020-06-21 DIAGNOSIS — Z96652 Presence of left artificial knee joint: Secondary | ICD-10-CM | POA: Diagnosis not present

## 2020-06-21 DIAGNOSIS — Z96651 Presence of right artificial knee joint: Secondary | ICD-10-CM | POA: Diagnosis not present

## 2020-06-21 DIAGNOSIS — Y92410 Unspecified street and highway as the place of occurrence of the external cause: Secondary | ICD-10-CM | POA: Diagnosis not present

## 2020-06-21 DIAGNOSIS — S0003XA Contusion of scalp, initial encounter: Secondary | ICD-10-CM | POA: Diagnosis not present

## 2020-06-21 DIAGNOSIS — J45909 Unspecified asthma, uncomplicated: Secondary | ICD-10-CM | POA: Diagnosis not present

## 2020-06-21 DIAGNOSIS — S0990XA Unspecified injury of head, initial encounter: Secondary | ICD-10-CM | POA: Diagnosis present

## 2020-06-21 MED ORDER — METHOCARBAMOL 500 MG PO TABS: 500.0000 mg | ORAL_TABLET | Freq: Three times a day (TID) | ORAL | 0 refills | Status: DC | PRN

## 2020-06-21 MED ORDER — ACETAMINOPHEN 325 MG PO TABS
650.0000 mg | ORAL_TABLET | Freq: Once | ORAL | Status: AC
  Administered 2020-06-21: 650 mg via ORAL
  Filled 2020-06-21: qty 2

## 2020-06-21 MED ORDER — METHOCARBAMOL 500 MG PO TABS
500.0000 mg | ORAL_TABLET | Freq: Once | ORAL | Status: AC
  Administered 2020-06-21: 500 mg via ORAL
  Filled 2020-06-21: qty 1
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
## 2020-06-21 NOTE — ED Notes (Signed)
Back from xray

## 2020-07-05 ENCOUNTER — Other Ambulatory Visit: Payer: Self-pay | Admitting: Internal Medicine

## 2020-07-05 DIAGNOSIS — Z1231 Encounter for screening mammogram for malignant neoplasm of breast: Secondary | ICD-10-CM

## 2020-08-26 ENCOUNTER — Ambulatory Visit
Admission: RE | Admit: 2020-08-26 | Discharge: 2020-08-26 | Disposition: A | Discharge status: Home / Self Care | Payer: Medicare PPO | Source: Ambulatory Visit | Attending: Internal Medicine | Admitting: Internal Medicine

## 2020-08-26 ENCOUNTER — Other Ambulatory Visit: Payer: Self-pay

## 2020-08-26 DIAGNOSIS — Z1231 Encounter for screening mammogram for malignant neoplasm of breast: Secondary | ICD-10-CM

## 2020-09-08 ENCOUNTER — Ambulatory Visit: Payer: Medicare PPO | Admitting: Neurology

## 2020-10-07 ENCOUNTER — Ambulatory Visit: Payer: Medicare PPO | Admitting: Physician Assistant

## 2020-10-26 ENCOUNTER — Other Ambulatory Visit: Payer: Self-pay

## 2020-10-26 ENCOUNTER — Ambulatory Visit: Payer: Medicare PPO | Admitting: Physician Assistant

## 2020-10-26 ENCOUNTER — Encounter: Payer: Self-pay | Admitting: Physician Assistant

## 2020-10-26 DIAGNOSIS — Z86018 Personal history of other benign neoplasm: Secondary | ICD-10-CM

## 2020-10-26 DIAGNOSIS — L814 Other melanin hyperpigmentation: Secondary | ICD-10-CM

## 2020-10-26 DIAGNOSIS — L578 Other skin changes due to chronic exposure to nonionizing radiation: Secondary | ICD-10-CM

## 2020-10-26 DIAGNOSIS — Z1283 Encounter for screening for malignant neoplasm of skin: Secondary | ICD-10-CM | POA: Diagnosis not present

## 2020-10-26 DIAGNOSIS — D229 Melanocytic nevi, unspecified: Secondary | ICD-10-CM | POA: Diagnosis not present

## 2020-10-26 DIAGNOSIS — Z85820 Personal history of malignant melanoma of skin: Secondary | ICD-10-CM

## 2020-10-26 DIAGNOSIS — D1800 Hemangioma unspecified site: Secondary | ICD-10-CM

## 2020-10-26 DIAGNOSIS — L821 Other seborrheic keratosis: Secondary | ICD-10-CM

## 2020-10-26 NOTE — Progress Notes (Signed)
   Follow-Up Visit   Subjective  [name] is a 75 y.o. female who presents for the following: Follow-up (6 month follow up history of mm and atypia ).   The following portions of the chart were reviewed this encounter and updated as appropriate:  Tobacco  Allergies  Meds  Problems  Med Hx  Surg Hx  Fam Hx      Objective  Well appearing patient in no apparent distress; mood and affect are within normal limits.  A full examination was performed including scalp, head, eyes, ears, nose, lips, neck, chest, axillae, abdomen, back, buttocks, bilateral upper extremities, bilateral lower extremities, hands, feet, fingers, toes, fingernails, and toenails. All findings within normal limits unless otherwise noted below.  head to toe No atypical nevi No signs of non-mole skin cancer. Dyspigmented scars clear.   Assessment & Plan  Encounter for screening for malignant neoplasm of skin head to toe  Skin exams every 9 months.  Lentigines - Scattered tan macules - Discussed due to sun exposure - Benign, observe - Call for any changes  Seborrheic Keratoses - Stuck-on, waxy, tan-brown papules and plaques  - Discussed benign etiology and prognosis. - Observe - Call for any changes  Melanocytic Nevi - Tan-brown and/or pink-flesh-colored symmetric macules and papules - Benign appearing on exam today - Observation - Call clinic for new or changing moles - Recommend daily use of broad spectrum spf 30+ sunscreen to sun-exposed areas.   Hemangiomas - Red papules - Discussed benign nature - Observe - Call for any changes  Actinic Damage - diffuse scaly erythematous macules with underlying dyspigmentation - Recommend daily broad spectrum sunscreen SPF 30+ to sun-exposed areas, reapply every 2 hours as needed.  - Call for new or changing lesions.  Skin cancer screening performed today.   I, Zyen Triggs, PA-C, have reviewed all documentation's for this visit.  The  documentation on 10/26/20 for the exam, diagnosis, procedures and orders are all accurate and complete.

## 2020-11-26 IMAGING — MG DIGITAL SCREENING BILAT W/ TOMO W/ CAD
8 series · 8 of 24 positions shown · non-contrast
Comparison: Previous exam(s).

CLINICAL DATA: Screening.

EXAM:
DIGITAL SCREENING BILATERAL MAMMOGRAM WITH TOMO AND CAD

[L CC synth-2D]
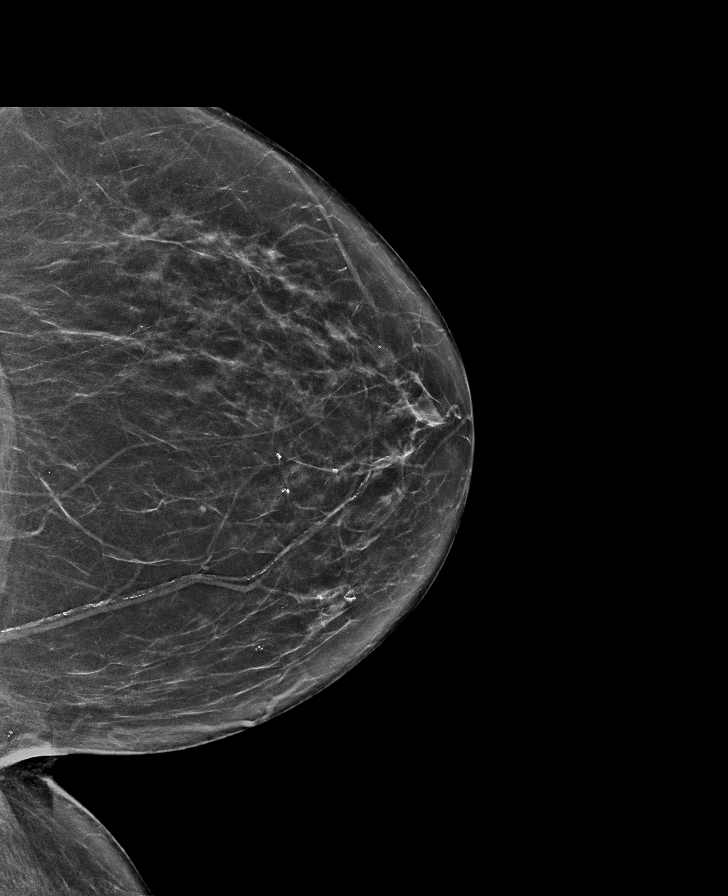

[R CC synth-2D]
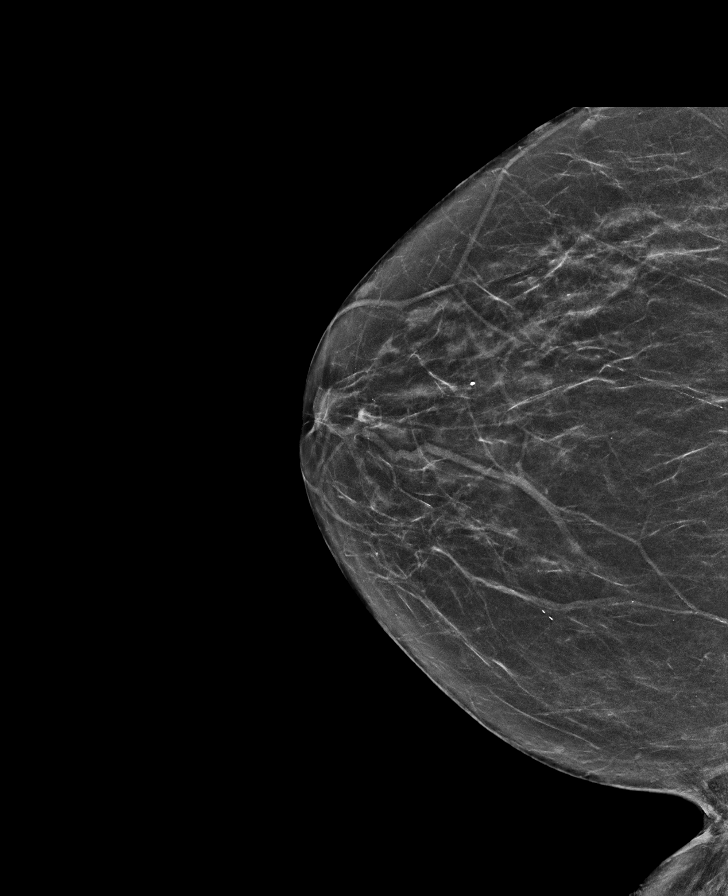

[L MLO synth-2D]
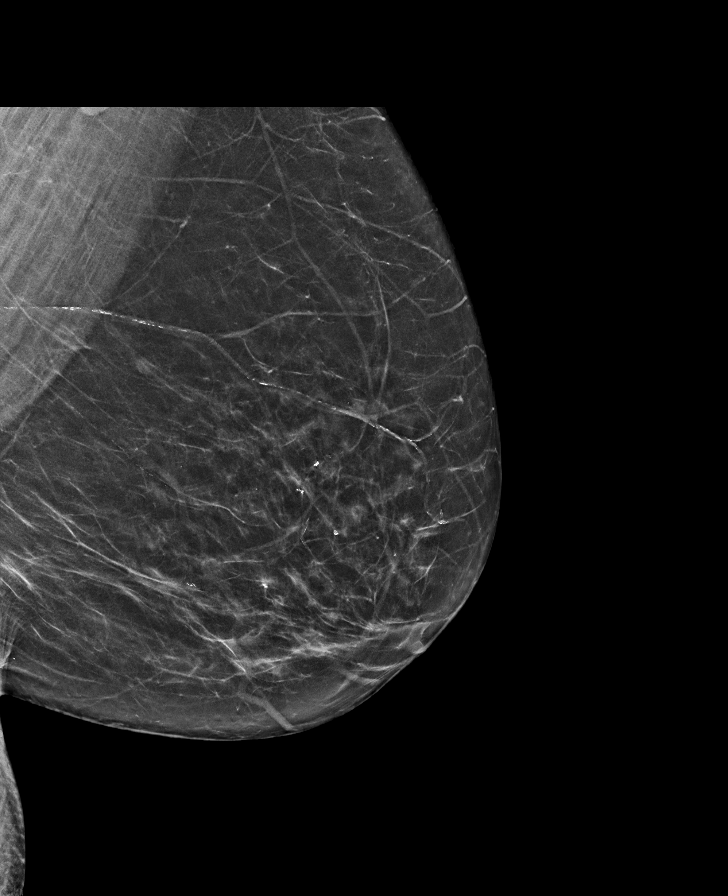

[R MLO synth-2D]
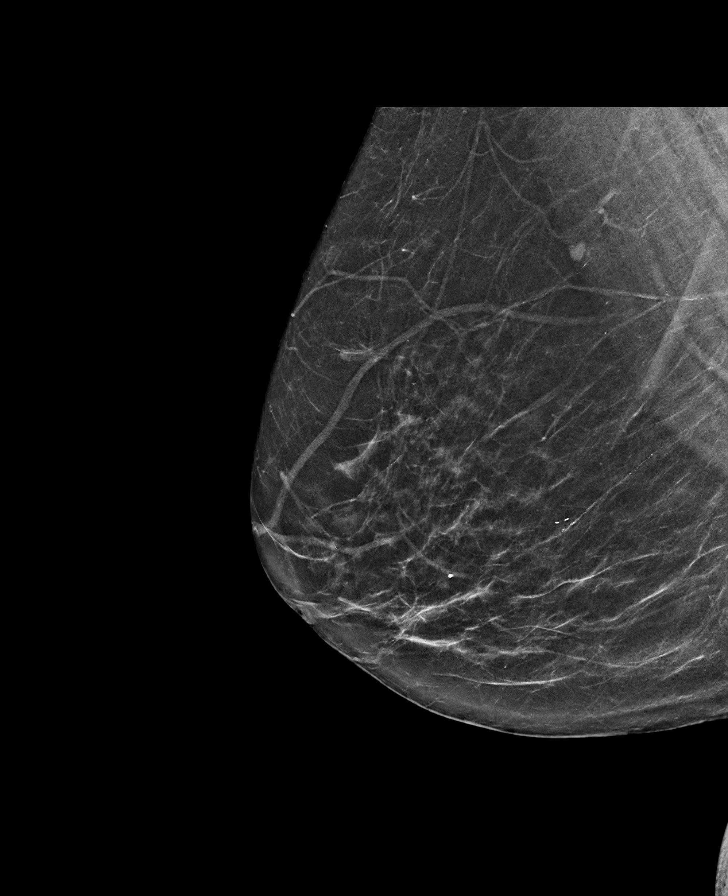

[R MLO tomo · tomo slice 34/67.0]
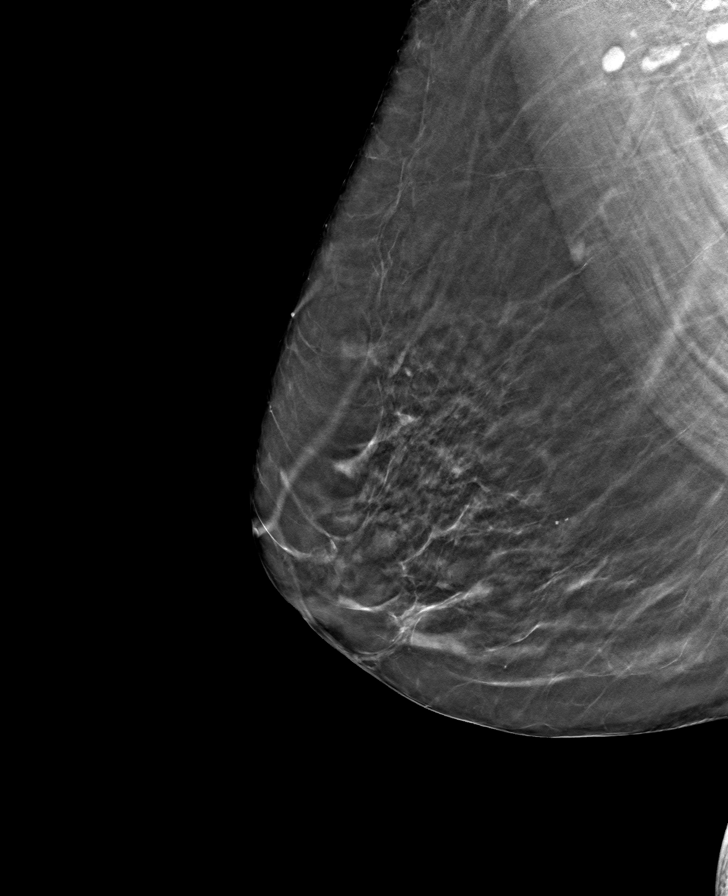

[L MLO tomo · tomo slice 33/66.0]
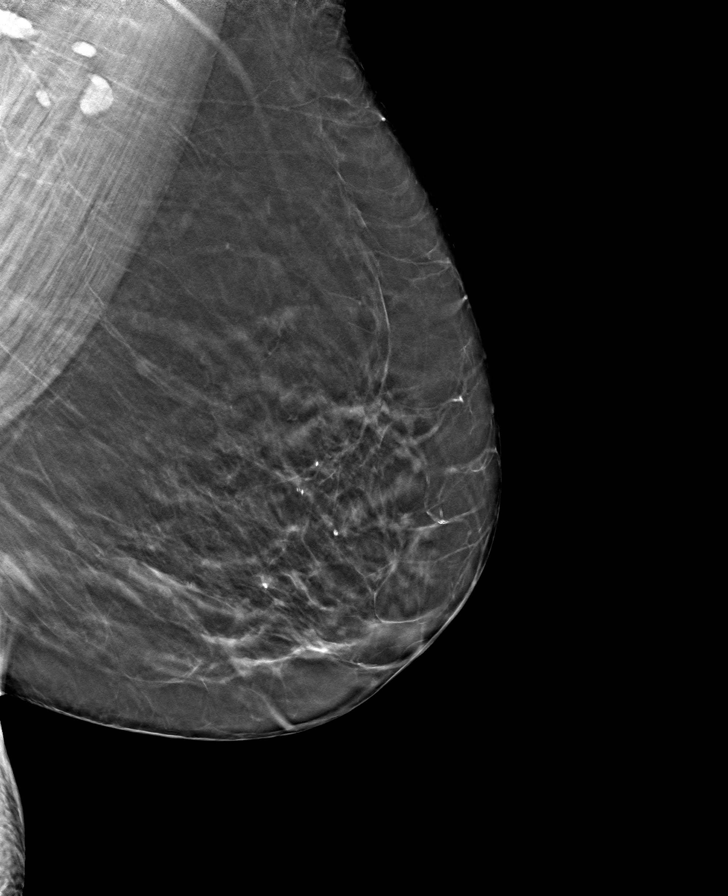

[L CC tomo · tomo slice 33/66.0]
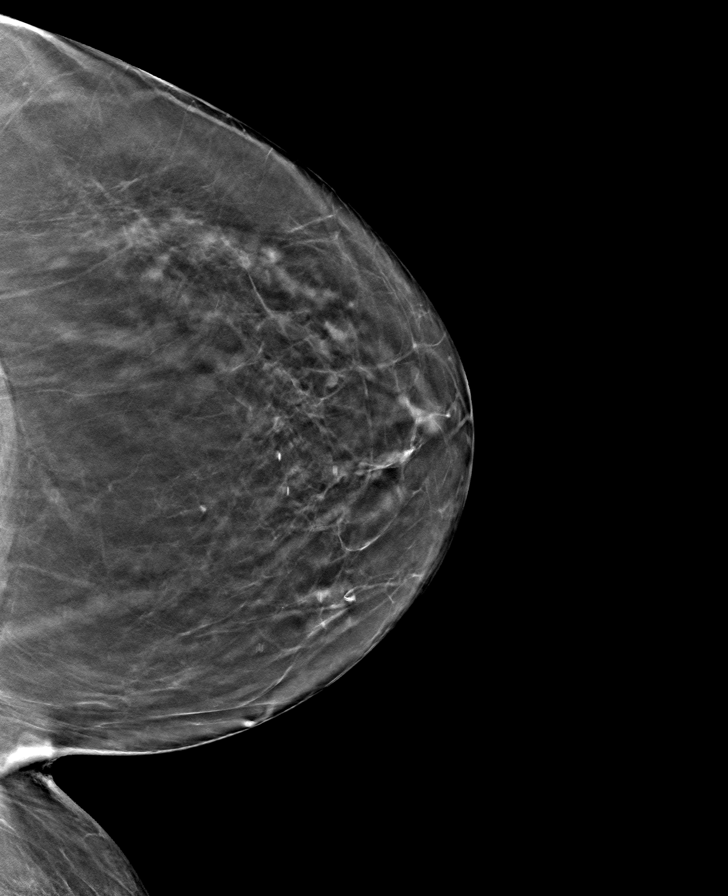

[R CC tomo · tomo slice 29/57.0]
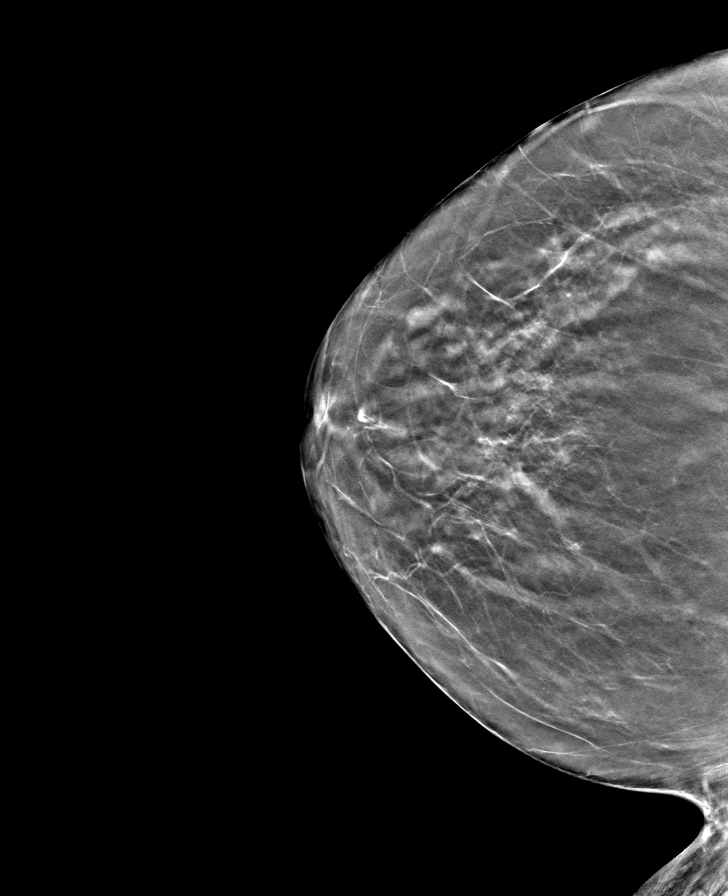

[8 of 24 positions shown; findings below may reference images not displayed]

ACR Breast Density Category b: There are scattered areas of
fibroglandular density.
FINDINGS: There are no findings suspicious for malignancy. Images were
processed with CAD.
IMPRESSION: No mammographic evidence of malignancy. A result letter of this
screening mammogram will be mailed directly to the patient.

RECOMMENDATION:
Screening mammogram in one year. (Code:[id number])

BI-RADS CATEGORY  1: Negative.

## 2021-02-11 ENCOUNTER — Other Ambulatory Visit: Payer: Self-pay | Admitting: Internal Medicine

## 2021-02-11 DIAGNOSIS — M810 Age-related osteoporosis without current pathological fracture: Secondary | ICD-10-CM

## 2021-02-21 ENCOUNTER — Ambulatory Visit
Admission: RE | Admit: 2021-02-21 | Discharge: 2021-02-21 | Disposition: A | Discharge status: Home / Self Care | Payer: Medicare PPO | Source: Ambulatory Visit | Attending: Internal Medicine | Admitting: Internal Medicine

## 2021-02-21 DIAGNOSIS — M810 Age-related osteoporosis without current pathological fracture: Secondary | ICD-10-CM

## 2021-07-26 ENCOUNTER — Ambulatory Visit: Payer: Medicare PPO | Admitting: Physician Assistant

## 2021-08-03 ENCOUNTER — Ambulatory Visit: Payer: Medicare PPO | Admitting: Physician Assistant

## 2021-08-03 ENCOUNTER — Encounter: Payer: Self-pay | Admitting: Physician Assistant

## 2021-08-03 DIAGNOSIS — Z85820 Personal history of malignant melanoma of skin: Secondary | ICD-10-CM

## 2021-08-03 DIAGNOSIS — Z1283 Encounter for screening for malignant neoplasm of skin: Secondary | ICD-10-CM | POA: Diagnosis not present

## 2021-08-03 DIAGNOSIS — D225 Melanocytic nevi of trunk: Secondary | ICD-10-CM

## 2021-08-03 DIAGNOSIS — D485 Neoplasm of uncertain behavior of skin: Secondary | ICD-10-CM

## 2021-08-03 DIAGNOSIS — L570 Actinic keratosis: Secondary | ICD-10-CM

## 2021-08-03 NOTE — Progress Notes (Signed)
   Follow-Up Visit   Subjective  [name] is a 76 y.o. female who presents for the following: Annual Exam (Patient here today for skin check, per patient she has a lesion on her right calf x months that's dry and crust no bleeding, no itching.  Patient would also like to discuss ridging in some of her fingernails x months. Personal history of atypical moles, and melanoma. Family history of non mole skin cancer. ).   The following portions of the chart were reviewed this encounter and updated as appropriate:  Tobacco  Allergies  Meds  Problems  Med Hx  Surg Hx  Fam Hx      Objective  Well appearing patient in no apparent distress; mood and affect are within normal limits.  A full examination was performed including scalp, head, eyes, ears, nose, lips, neck, chest, axillae, abdomen, back, buttocks, bilateral upper extremities, bilateral lower extremities, hands, feet, fingers, toes, fingernails, and toenails. All findings within normal limits unless otherwise noted below.  No signs of non-mole skin cancer. Full body skin exam.  upper back Dyspigmented scar.   Right Upper Back       Right inner shin Pink hyperkeratotic thin plaque   Assessment & Plan  Screening for malignant neoplasm of skin  Biannual skin exam.  Personal history of malignant melanoma of skin upper back  Biannual skin exams  Neoplasm of uncertain behavior of skin Right Upper Back  Skin / nail biopsy Type of biopsy: tangential   Informed consent: discussed and consent obtained   Timeout: patient name, date of birth, surgical site, and procedure verified   Anesthesia: the lesion was anesthetized in a standard fashion   Anesthetic:  1% lidocaine w/ epinephrine 1-100,000 local infiltration Instrument used: flexible razor blade   Hemostasis achieved with: ferric subsulfate   Outcome: patient tolerated procedure well   Post-procedure details: wound care instructions given    Specimen 1  - Surgical pathology Differential Diagnosis: atypia  Check Margins: YES  Lichenoid keratosis Right inner shin  Observe for changes.     I, Juquan Reznick, PA-C, have reviewed all documentation's for this visit.  The documentation on 08/03/21 for the exam, diagnosis, procedures and orders are all accurate and complete.

## 2021-08-03 NOTE — Patient Instructions (Signed)

## 2021-08-08 ENCOUNTER — Telehealth: Payer: Self-pay

## 2021-08-08 NOTE — Telephone Encounter (Signed)
Phone call to patient with her pathology results. Patient aware of results.  

## 2021-08-08 NOTE — Telephone Encounter (Signed)
-----   Message from Warren Danes, [place] sent at 08/04/2021  3:49 PM EDT ----- Moderate.RTC if recurs.

## 2021-08-09 ENCOUNTER — Other Ambulatory Visit: Payer: Self-pay | Admitting: Internal Medicine

## 2021-08-09 DIAGNOSIS — Z1231 Encounter for screening mammogram for malignant neoplasm of breast: Secondary | ICD-10-CM

## 2021-08-11 ENCOUNTER — Encounter (HOSPITAL_COMMUNITY): Payer: Medicare PPO

## 2021-08-11 ENCOUNTER — Other Ambulatory Visit (HOSPITAL_COMMUNITY): Payer: Self-pay | Admitting: *Deleted

## 2021-08-12 ENCOUNTER — Encounter (HOSPITAL_COMMUNITY)
Admission: RE | Admit: 2021-08-12 | Discharge: 2021-08-12 | Disposition: A | Discharge status: Home / Self Care | Payer: Medicare PPO | Source: Ambulatory Visit | Attending: Internal Medicine | Admitting: Internal Medicine

## 2021-08-12 DIAGNOSIS — M810 Age-related osteoporosis without current pathological fracture: Secondary | ICD-10-CM | POA: Diagnosis present

## 2021-08-12 MED ORDER — ZOLEDRONIC ACID 5 MG/100ML IV SOLN
5.0000 mg | Freq: Once | INTRAVENOUS | Status: AC
  Administered 2021-08-12: 5 mg via INTRAVENOUS

## 2021-08-29 ENCOUNTER — Ambulatory Visit
Admission: RE | Admit: 2021-08-29 | Discharge: 2021-08-29 | Disposition: A | Discharge status: Home / Self Care | Payer: Medicare PPO | Source: Ambulatory Visit | Attending: Internal Medicine | Admitting: Internal Medicine

## 2021-08-29 DIAGNOSIS — Z1231 Encounter for screening mammogram for malignant neoplasm of breast: Secondary | ICD-10-CM

## 2021-08-31 ENCOUNTER — Other Ambulatory Visit: Payer: Self-pay | Admitting: Internal Medicine

## 2021-08-31 DIAGNOSIS — R928 Other abnormal and inconclusive findings on diagnostic imaging of breast: Secondary | ICD-10-CM

## 2021-09-06 ENCOUNTER — Ambulatory Visit: Payer: Medicare PPO

## 2021-09-06 ENCOUNTER — Ambulatory Visit
Admission: RE | Admit: 2021-09-06 | Discharge: 2021-09-06 | Disposition: A | Discharge status: Home / Self Care | Payer: Medicare PPO | Source: Ambulatory Visit | Attending: Internal Medicine | Admitting: Internal Medicine

## 2021-09-06 DIAGNOSIS — R928 Other abnormal and inconclusive findings on diagnostic imaging of breast: Secondary | ICD-10-CM

## 2021-09-07 ENCOUNTER — Other Ambulatory Visit: Payer: Medicare PPO

## 2022-07-27 ENCOUNTER — Other Ambulatory Visit (HOSPITAL_COMMUNITY): Payer: Self-pay | Admitting: *Deleted

## 2022-08-01 ENCOUNTER — Ambulatory Visit (HOSPITAL_COMMUNITY)
Admission: RE | Admit: 2022-08-01 | Discharge: 2022-08-01 | Disposition: A | Discharge status: Home / Self Care | Payer: Medicare PPO | Source: Ambulatory Visit | Attending: Internal Medicine | Admitting: Internal Medicine

## 2022-08-01 DIAGNOSIS — M810 Age-related osteoporosis without current pathological fracture: Secondary | ICD-10-CM | POA: Insufficient documentation

## 2022-08-01 MED ORDER — ZOLEDRONIC ACID 5 MG/100ML IV SOLN
5.0000 mg | Freq: Once | INTRAVENOUS | Status: AC
  Administered 2022-08-01: 5 mg via INTRAVENOUS

## 2022-08-01 MED ORDER — ZOLEDRONIC ACID 5 MG/100ML IV SOLN
INTRAVENOUS | Status: AC
  Filled 2022-08-01: qty 100

## 2022-08-07 ENCOUNTER — Other Ambulatory Visit: Payer: Self-pay | Admitting: Internal Medicine

## 2022-08-07 DIAGNOSIS — Z1231 Encounter for screening mammogram for malignant neoplasm of breast: Secondary | ICD-10-CM

## 2022-09-05 ENCOUNTER — Ambulatory Visit
Admission: RE | Admit: 2022-09-05 | Discharge: 2022-09-05 | Disposition: A | Discharge status: Home / Self Care | Payer: Medicare PPO | Source: Ambulatory Visit | Attending: Internal Medicine | Admitting: Internal Medicine

## 2022-09-05 DIAGNOSIS — Z1231 Encounter for screening mammogram for malignant neoplasm of breast: Secondary | ICD-10-CM

## 2022-09-08 ENCOUNTER — Other Ambulatory Visit: Payer: Self-pay | Admitting: Internal Medicine

## 2022-09-08 DIAGNOSIS — R928 Other abnormal and inconclusive findings on diagnostic imaging of breast: Secondary | ICD-10-CM

## 2022-09-19 ENCOUNTER — Other Ambulatory Visit: Payer: Self-pay | Admitting: Internal Medicine

## 2022-09-19 ENCOUNTER — Ambulatory Visit
Admission: RE | Admit: 2022-09-19 | Discharge: 2022-09-19 | Disposition: A | Discharge status: Home / Self Care | Payer: Medicare PPO | Source: Ambulatory Visit | Attending: Internal Medicine | Admitting: Internal Medicine

## 2022-09-19 DIAGNOSIS — R928 Other abnormal and inconclusive findings on diagnostic imaging of breast: Secondary | ICD-10-CM

## 2022-10-12 DIAGNOSIS — L820 Inflamed seborrheic keratosis: Secondary | ICD-10-CM | POA: Diagnosis not present

## 2023-01-09 DIAGNOSIS — D2239 Melanocytic nevi of other parts of face: Secondary | ICD-10-CM | POA: Diagnosis not present

## 2023-01-09 DIAGNOSIS — R5383 Other fatigue: Secondary | ICD-10-CM | POA: Diagnosis not present

## 2023-01-09 DIAGNOSIS — D2261 Melanocytic nevi of right upper limb, including shoulder: Secondary | ICD-10-CM | POA: Diagnosis not present

## 2023-01-09 DIAGNOSIS — L821 Other seborrheic keratosis: Secondary | ICD-10-CM | POA: Diagnosis not present

## 2023-01-09 DIAGNOSIS — D485 Neoplasm of uncertain behavior of skin: Secondary | ICD-10-CM | POA: Diagnosis not present

## 2023-01-09 DIAGNOSIS — L820 Inflamed seborrheic keratosis: Secondary | ICD-10-CM | POA: Diagnosis not present

## 2023-01-09 DIAGNOSIS — L570 Actinic keratosis: Secondary | ICD-10-CM | POA: Diagnosis not present

## 2023-01-09 DIAGNOSIS — Z85820 Personal history of malignant melanoma of skin: Secondary | ICD-10-CM | POA: Diagnosis not present

## 2023-01-09 DIAGNOSIS — D2262 Melanocytic nevi of left upper limb, including shoulder: Secondary | ICD-10-CM | POA: Diagnosis not present

## 2023-01-09 DIAGNOSIS — L603 Nail dystrophy: Secondary | ICD-10-CM | POA: Diagnosis not present

## 2023-01-09 DIAGNOSIS — N390 Urinary tract infection, site not specified: Secondary | ICD-10-CM | POA: Diagnosis not present

## 2023-02-08 DIAGNOSIS — H43813 Vitreous degeneration, bilateral: Secondary | ICD-10-CM | POA: Diagnosis not present

## 2023-02-08 DIAGNOSIS — H26492 Other secondary cataract, left eye: Secondary | ICD-10-CM | POA: Diagnosis not present

## 2023-02-08 DIAGNOSIS — H52203 Unspecified astigmatism, bilateral: Secondary | ICD-10-CM | POA: Diagnosis not present

## 2023-02-08 DIAGNOSIS — H524 Presbyopia: Secondary | ICD-10-CM | POA: Diagnosis not present

## 2023-02-08 DIAGNOSIS — H5111 Convergence insufficiency: Secondary | ICD-10-CM | POA: Diagnosis not present

## 2023-02-08 DIAGNOSIS — H04123 Dry eye syndrome of bilateral lacrimal glands: Secondary | ICD-10-CM | POA: Diagnosis not present

## 2023-02-15 DIAGNOSIS — N1832 Chronic kidney disease, stage 3b: Secondary | ICD-10-CM | POA: Diagnosis not present

## 2023-02-15 DIAGNOSIS — I129 Hypertensive chronic kidney disease with stage 1 through stage 4 chronic kidney disease, or unspecified chronic kidney disease: Secondary | ICD-10-CM | POA: Diagnosis not present

## 2023-02-15 DIAGNOSIS — F331 Major depressive disorder, recurrent, moderate: Secondary | ICD-10-CM | POA: Diagnosis not present

## 2023-02-15 DIAGNOSIS — Z79899 Other long term (current) drug therapy: Secondary | ICD-10-CM | POA: Diagnosis not present

## 2023-02-15 DIAGNOSIS — M810 Age-related osteoporosis without current pathological fracture: Secondary | ICD-10-CM | POA: Diagnosis not present

## 2023-02-15 DIAGNOSIS — R7989 Other specified abnormal findings of blood chemistry: Secondary | ICD-10-CM | POA: Diagnosis not present

## 2023-02-16 DIAGNOSIS — R7301 Impaired fasting glucose: Secondary | ICD-10-CM | POA: Diagnosis not present

## 2023-02-22 DIAGNOSIS — M810 Age-related osteoporosis without current pathological fracture: Secondary | ICD-10-CM | POA: Diagnosis not present

## 2023-02-22 DIAGNOSIS — Z6832 Body mass index (BMI) 32.0-32.9, adult: Secondary | ICD-10-CM | POA: Diagnosis not present

## 2023-02-22 DIAGNOSIS — G319 Degenerative disease of nervous system, unspecified: Secondary | ICD-10-CM | POA: Diagnosis not present

## 2023-02-22 DIAGNOSIS — R221 Localized swelling, mass and lump, neck: Secondary | ICD-10-CM | POA: Diagnosis not present

## 2023-02-22 DIAGNOSIS — N1832 Chronic kidney disease, stage 3b: Secondary | ICD-10-CM | POA: Diagnosis not present

## 2023-02-22 DIAGNOSIS — Z0000 Encounter for general adult medical examination without abnormal findings: Secondary | ICD-10-CM | POA: Diagnosis not present

## 2023-02-22 DIAGNOSIS — F331 Major depressive disorder, recurrent, moderate: Secondary | ICD-10-CM | POA: Diagnosis not present

## 2023-02-22 DIAGNOSIS — I129 Hypertensive chronic kidney disease with stage 1 through stage 4 chronic kidney disease, or unspecified chronic kidney disease: Secondary | ICD-10-CM | POA: Diagnosis not present

## 2023-02-22 DIAGNOSIS — E669 Obesity, unspecified: Secondary | ICD-10-CM | POA: Diagnosis not present

## 2023-03-05 ENCOUNTER — Other Ambulatory Visit: Payer: Self-pay | Admitting: Internal Medicine

## 2023-03-05 DIAGNOSIS — R221 Localized swelling, mass and lump, neck: Secondary | ICD-10-CM

## 2023-03-06 ENCOUNTER — Ambulatory Visit
Admission: RE | Admit: 2023-03-06 | Discharge: 2023-03-06 | Disposition: A | Discharge status: Home / Self Care | Payer: Medicare PPO | Source: Ambulatory Visit | Attending: Internal Medicine | Admitting: Internal Medicine

## 2023-03-06 DIAGNOSIS — R221 Localized swelling, mass and lump, neck: Secondary | ICD-10-CM | POA: Diagnosis not present

## 2023-03-06 MED ORDER — IOPAMIDOL (ISOVUE-300) INJECTION 61%
75.0000 mL | Freq: Once | INTRAVENOUS | Status: AC | PRN
  Administered 2023-03-06: 75 mL via INTRAVENOUS

## 2023-03-19 ENCOUNTER — Telehealth: Payer: Self-pay | Admitting: Gastroenterology

## 2023-03-19 NOTE — Telephone Encounter (Signed)
Good morning Dr. Meridee Score,  I received a call form this patient requesting to schedule her recall colonoscopy. This patient also has a referral for her recall. Would you please advise on scheduling this patient.    Thank you.

## 2023-03-19 NOTE — Telephone Encounter (Signed)
I will review this when I return to the office on Friday.

## 2023-03-20 NOTE — Telephone Encounter (Signed)
Due to patient's age and also recent neck imaging showing a "stable neck mass" she needs to be evaluated in the clinic setting to ensure safety for repeat procedures based on age/comorbidities and also based on her exam if she can be done in the [facility]-based outpatient setting. Schedule next available with me or any of my APPs. Thanks. GM

## 2023-04-23 DIAGNOSIS — R058 Other specified cough: Secondary | ICD-10-CM | POA: Diagnosis not present

## 2023-04-23 DIAGNOSIS — J45909 Unspecified asthma, uncomplicated: Secondary | ICD-10-CM | POA: Diagnosis not present

## 2023-05-25 ENCOUNTER — Ambulatory Visit: Payer: Medicare PPO | Admitting: Gastroenterology

## 2023-05-30 ENCOUNTER — Other Ambulatory Visit: Payer: Self-pay | Admitting: Internal Medicine

## 2023-05-30 DIAGNOSIS — R221 Localized swelling, mass and lump, neck: Secondary | ICD-10-CM

## 2023-06-13 ENCOUNTER — Ambulatory Visit
Admission: RE | Admit: 2023-06-13 | Discharge: 2023-06-13 | Disposition: A | Discharge status: Home / Self Care | Source: Ambulatory Visit | Attending: Internal Medicine | Admitting: Internal Medicine

## 2023-06-13 DIAGNOSIS — R221 Localized swelling, mass and lump, neck: Secondary | ICD-10-CM | POA: Diagnosis not present

## 2023-06-13 DIAGNOSIS — M4312 Spondylolisthesis, cervical region: Secondary | ICD-10-CM | POA: Diagnosis not present

## 2023-06-13 MED ORDER — IOPAMIDOL (ISOVUE-370) INJECTION 76%
75.0000 mL | Freq: Once | INTRAVENOUS | Status: AC | PRN
  Administered 2023-06-13: 75 mL via INTRAVENOUS

## 2023-07-10 DIAGNOSIS — F419 Anxiety disorder, unspecified: Secondary | ICD-10-CM | POA: Diagnosis not present

## 2023-07-13 ENCOUNTER — Other Ambulatory Visit (INDEPENDENT_AMBULATORY_CARE_PROVIDER_SITE_OTHER)

## 2023-07-13 ENCOUNTER — Ambulatory Visit: Admitting: Gastroenterology

## 2023-07-13 ENCOUNTER — Encounter: Payer: Self-pay | Admitting: Gastroenterology

## 2023-07-13 VITALS — BP 120/62 | HR 68 | Ht 63.0 in | Wt 191.0 lb

## 2023-07-13 DIAGNOSIS — Z860101 Personal history of adenomatous and serrated colon polyps: Secondary | ICD-10-CM | POA: Diagnosis not present

## 2023-07-13 DIAGNOSIS — Z860100 Personal history of colon polyps, unspecified: Secondary | ICD-10-CM | POA: Diagnosis not present

## 2023-07-13 DIAGNOSIS — K449 Diaphragmatic hernia without obstruction or gangrene: Secondary | ICD-10-CM

## 2023-07-13 DIAGNOSIS — Z862 Personal history of diseases of the blood and blood-forming organs and certain disorders involving the immune mechanism: Secondary | ICD-10-CM

## 2023-07-13 LAB — CBC
HCT: 33.4 % — ABNORMAL LOW (ref 36.0–46.0)
Hemoglobin: 11 g/dL — ABNORMAL LOW (ref 12.0–15.0)
MCHC: 32.8 g/dL (ref 30.0–36.0)
MCV: 86.6 fl (ref 78.0–100.0)
Platelets: 235 10*3/uL (ref 150.0–400.0)
RBC: 3.86 Mil/uL — ABNORMAL LOW (ref 3.87–5.11)
RDW: 14.1 % (ref 11.5–15.5)
WBC: 6.9 10*3/uL (ref 4.0–10.5)

## 2023-07-13 LAB — BASIC METABOLIC PANEL WITH GFR
BUN: 19 mg/dL (ref 6–23)
CO2: 29 meq/L (ref 19–32)
Calcium: 8.9 mg/dL (ref 8.4–10.5)
Chloride: 105 meq/L (ref 96–112)
Creatinine, Ser: 1.33 mg/dL — ABNORMAL HIGH (ref 0.40–1.20)
GFR: 38.33 mL/min — ABNORMAL LOW (ref >60.00)
Glucose, Bld: 82 mg/dL (ref 70–99)
Potassium: 3.9 meq/L (ref 3.5–5.1)
Sodium: 141 meq/L (ref 135–145)

## 2023-07-13 LAB — IBC + FERRITIN
Ferritin: 17.4 ng/mL (ref 10.0–291.0)
Iron: 48 ug/dL (ref 42–145)
Saturation Ratios: 11.9 % — ABNORMAL LOW (ref 20.0–50.0)
TIBC: 403.2 ug/dL (ref 250.0–450.0)
Transferrin: 288 mg/dL (ref 212.0–360.0)

## 2023-07-13 LAB — B12 AND FOLATE PANEL
Folate: 7.2 ng/mL (ref >5.9)
Vitamin B-12: >1500 pg/mL — ABNORMAL HIGH (ref 211–911)

## 2023-07-13 MED ORDER — NA SULFATE-K SULFATE-MG SULF 17.5-3.13-1.6 GM/177ML PO SOLN: 1.0000 | ORAL | 0 refills | Status: DC

## 2023-07-13 NOTE — Patient Instructions (Addendum)
 _______________________________________________________  If your blood pressure at your visit was 140/90 or greater, please contact your primary care physician to follow up on this.  _______________________________________________________  If you are age 79 or older, your body mass index should be between 23-30. Your Body mass index is 33.83 kg/m. If this is out of the aforementioned range listed, please consider follow up with your Primary Care Provider.  If you are age 31 or younger, your body mass index should be between 19-25. Your Body mass index is 33.83 kg/m. If this is out of the aformentioned range listed, please consider follow up with your Primary Care Provider.  ________________________________________________________  The [facility] providers would like to encourage you to use MYCHART to communicate with providers for non-urgent requests or questions.  Due to long hold times on the telephone, sending your provider a message by [facility] may be a faster and more efficient way to get a response.  Please allow 48 business hours for a response.  Please remember that this is for non-urgent requests.  _______________________________________________________  Your provider has requested that you go to the basement level for lab work before leaving today. Press B on the elevator. The lab is located at the first door on the left as you exit the elevator.  You have been scheduled for an endoscopy and colonoscopy. Please follow the written instructions given to you at your visit today.  If you use inhalers (even only as needed), please bring them with you on the day of your procedure.  DO NOT TAKE 7 DAYS PRIOR TO TEST- Trulicity (dulaglutide) Ozempic, Wegovy (semaglutide) Mounjaro (tirzepatide) Bydureon Bcise (exanatide extended release)  DO NOT TAKE 1 DAY PRIOR TO YOUR TEST Rybelsus (semaglutide) Adlyxin (lixisenatide) Victoza (liraglutide) Byetta  (exanatide) ___________________________________________________________________________   Due to recent changes in healthcare laws, you may see the results of your imaging and laboratory studies on MyChart before your provider has had a chance to review them.  We understand that in some cases there may be results that are confusing or concerning to you. Not all laboratory results come back in the same time frame and the provider may be waiting for multiple results in order to interpret others.  Please give us  48 hours in order for your provider to thoroughly review all the results before contacting the office for clarification of your results.   Thank you for entrusting me with your care and choosing [facility].  Dr Brice Campi

## 2023-07-13 NOTE — Progress Notes (Unsigned)
 GASTROENTEROLOGY OUTPATIENT CLINIC VISIT   Primary Care Provider Azalia Leo, MD [address] [place] [address] [phone]  Patient Profile: [name] is a 78 y.o. female with a pmh significant for hypertension, asthma/COPD, seasonal allergies, anxiety, prior IDA, status post cholecystectomy, GERD, large hiatal hernia, colon polyps (TAs).  The patient presents to the [facility] for an evaluation and management of problem(s) noted below:  Problem List No diagnosis found.   History of Present Illness Please see initial consultation note by [facility] for full details of HPI.  Interval History The patient returns for scheduled follow-up.  She describes to me a more significant history of chronic diarrhea.  Based on the prior notation in the chart it was not completely clear that the chronicity of her diarrhea was what she is currently stating has been ongoing for many years.  She will have days of some normal bowel movements but for the most part her stools are looser or semiformed.  No blood in her stools.  Not having significant GERD symptoms or dysphagia.  She is wondering if there is anything else that can be done for her diarrhea.  GI Review of Systems Positive as above Negative for odynophagia, nausea, vomiting, pain, melena, hematochezia  Review of Systems General: Denies fevers/chills/weight loss unintentionally Cardiovascular: Denies chest pain/palpitations Pulmonary: Denies change in status of shortness of breath/cough Gastroenterological: See HPI Genitourinary: Denies darkened urine  Hematological: Denies easy bruising/bleeding Endocrine: Denies temperature intolerance Dermatological: Denies jaundice Psychological: Mood is stable   Medications Current Outpatient Medications  Medication Sig Dispense Refill   albuterol  (PROVENTIL  HFA;VENTOLIN  HFA) 108 (90 BASE) MCG/ACT inhaler Inhale 2 puffs into the lungs every 6 (six) hours as  needed for wheezing or shortness of breath.      Calcium  Carb-Cholecalciferol (CALCIUM  600 + D PO) Take 1 tablet by mouth 2 (two) times daily.     clobetasol  cream (TEMOVATE ) 0.05 % Apply 1 application topically daily. psoriasis 60 g 11   clonazePAM (KLONOPIN) 0.5 MG tablet Take 0.5 mg by mouth 2 (two) times daily as needed for anxiety.      cyanocobalamin 1000 MCG tablet Take by mouth.     diclofenac sodium (VOLTAREN) 1 % GEL Apply 1 application topically 4 (four) times daily as needed (pain).      DULoxetine (CYMBALTA) 60 MG capsule Take 60 mg by mouth daily.     EPINEPHrine  0.3 mg/0.3 mL IJ SOAJ injection Inject 0.6 mg into the muscle as needed for anaphylaxis.     fexofenadine (ALLEGRA) 180 MG tablet Take 180 mg by mouth daily.     FLUoxetine (PROZAC) 40 MG capsule Take 40 mg by mouth Daily.      fluticasone (FLONASE) 50 MCG/ACT nasal spray one spray by Both Nostrils route daily.     ibuprofen (ADVIL) 200 MG tablet Take 400 mg by mouth 2 (two) times daily as needed for moderate pain.     L-Theanine 100 MG CAPS Take 100 mg by mouth at bedtime as needed (sleep).     losartan (COZAAR) 100 MG tablet Take 100 mg by mouth Daily.      Melatonin 10 MG TABS Take 10 mg by mouth at bedtime as needed (sleep).     NONFORMULARY OR COMPOUNDED ITEM Fluconazole  200mg  crush tabs in 30ml DMSO # 2oz 1-2 Drops Under The Nail 2 each 2   omeprazole (PRILOSEC) 20 MG capsule Take by mouth.     Omeprazole 20 MG TBEC Take 20  mg by mouth Daily.      Polyethyl Glycol-Propyl Glycol (SYSTANE OP) Place 1 drop into both eyes daily as needed (dry eyes).     salmeterol (SEREVENT) 50 MCG/DOSE diskus inhaler Inhale 1 puff into the lungs 2 (two) times daily as needed (shortness of breath).     traMADol -acetaminophen  (ULTRACET ) 37.5-325 MG tablet Take 2 tablets by mouth 3 (three) times daily as needed.     traZODone (DESYREL) 100 MG tablet Take 100 mg by mouth at bedtime as needed for sleep.     Vitamin D, Ergocalciferol,  (DRISDOL) 1.25 MG (50000 UNIT) CAPS capsule TAKE 1 CAPSULE BY MOUTH 1 TIME A WEEK     alendronate (FOSAMAX) 70 MG tablet Take 70 mg by mouth every Sunday. Take with a full glass of water on an empty stomach.     Biotin w/ Vitamins C & E (HAIR/SKIN/NAILS PO) Take 2 tablets by mouth daily.     doxazosin (CARDURA) 4 MG tablet Take 4 mg by mouth at bedtime.     furosemide (LASIX) 20 MG tablet Take 20 mg by mouth daily.     hydrOXYzine (ATARAX) 50 MG tablet Take by mouth.     methocarbamol  (ROBAXIN ) 500 MG tablet Take 1 tablet (500 mg total) by mouth every 8 (eight) hours as needed for muscle spasms. 10 tablet 0   No current facility-administered medications for this visit.    Allergies Allergies  Allergen Reactions   Wasp Venom Protein Anaphylaxis   Other     Pt allergic to any kind of bee, wasp, jellow jacket, etc    Histories Past Medical History:  Diagnosis Date   Allergy    Hay fever/bee stings   Anemia    Anxiety    Arthritis    Asthma    Atypical mole 04/08/2020   Right Antecubital Fossa (moderate)   Atypical mole 08/03/2021   Right Upper Back (moderate)   Atypical nevi 12/02/2005   MID UPPER BACK SLIGHT TO MODERATE   Atypical nevi 10/31/2005   UPPER LEFT BACK MILD   Atypical nevi 10/31/2005   UPPER MID BACK MODERATE MARKED TX EXC   Atypical nevi 10/31/2005   LOWER MID BACK SLIGHT   Atypical nevi 10/31/2005   RIGHT UPPER ARM SLIGHT   Atypical nevi 07/07/2008   RIGHT FOREARM MILD   Atypical nevi 01/05/2009   LEFT INNER SHIN SLIGHT /MODERATE   Atypical nevi 01/13/2009   LEFT WRIST SLIGHT MODERATE   Atypical nevi 04/05/2011   UPPER BACK MILD/MOD.   Atypical nevi 04/05/2011   LEFT UPPER ARM MOD.   Atypical nevi 04/05/2011   LEFT OUTER THIGH MILD   Atypical nevi 04/05/2011   LEFT BUTTOCK MILD   Atypical nevi 04/05/2011   RIGHT SHIN MODERATE   Atypical nevi 10/06/2012   LEFT LEG MILD   Atypical nevi 10/06/2012   RIGHT UPPER ARM MILD   Atypical nevi 10/06/2012    UPPER MID BACK MILD   Atypical nevi 10/06/2012   RIGHT LOWER BACK MILD   Atypical nevi 10/06/2012   BACK OF LEFT LEG MODERATE   Atypical nevi 03/11/2013   LEFT OUTER BREAST MILD   Atypical nevi 03/11/2013   RIGHT POST KNEE MILD   Atypical nevi 03/11/2013   LOWER RIGHT SHOULDER MILD   Atypical nevi 03/11/2013   RIGHT CHEEK MODERATE TX WIDER SHAVE X2   Atypical nevi 09/08/2014   RIGHT LATERAL THIGH MILD   Atypical nevi 09/08/2014   RIGHT POST SHOULDER MILD  Atypical nevi 12/26/2016   LEFT TRAPEZIUS MILD   Atypical nevus 12/26/2016   LEFT THIGH MILD   Atypical nevus 06/06/2017   RIGHT FOREARM MILD   Atypical nevus 10/16/2018   LEFT MID BACK MOD.   Atypical nevus 10/16/2018   LEFT ARM MODERATE TX WIDER SHAVE FREE MARGIN   Atypical nevus 10/16/2018   RIGHT UPPER ARM MILD   Atypical nevus 10/16/2018   RIGHT ELBOW MODERATE   Blood transfusion without reported diagnosis 2012   Cataract    Depression    GERD (gastroesophageal reflux disease)    Heart murmur    History of kidney stones    Hypertension    Melanoma (HCC) 12/02/2004   LEFT TRAPEZIUS TX EXC   Osteoporosis    Pneumonia    hx of    Post concussion syndrome    Past Surgical History:  Procedure Laterality Date   ABDOMINAL HYSTERECTOMY  1986   CATARACT EXTRACTION, BILATERAL  2020   CHOLECYSTECTOMY  12/2019   MELANOMA EXCISION  2009   left shoulder near neck   REPLACEMENT TOTAL KNEE BILATERAL  05/2010 &05/2010   Social History   Socioeconomic History   Marital status: Single    Spouse name: Not on file   Number of children: Not on file   Years of education: Not on file   Highest education level: Not on file  Occupational History   Not on file  Tobacco Use   Smoking status: Never   Smokeless tobacco: Never  Vaping Use   Vaping status: Never Used  Substance and Sexual Activity   Alcohol use: No   Drug use: No   Sexual activity: Not Currently  Other Topics Concern   Not on file  Social History  Narrative   Not on file   Social Drivers of Health   Financial Resource Strain: Low Risk  (07/07/2023)   Received from [facility]   Overall Financial Resource Strain (CARDIA)    Difficulty of Paying Living Expenses: Not very hard  Food Insecurity: No Food Insecurity (07/07/2023)   Received from [facility]   Hunger Vital Sign    Within the past 12 months, you worried that your food would run out before you got the money to buy more.: Never true    Within the past 12 months, the food you bought just didn't last and you didn't have money to get more.: Never true  Transportation Needs: No Transportation Needs (07/07/2023)   Received from [facility] - Transportation    Lack of Transportation (Medical): No    Lack of Transportation (Non-Medical): No  Physical Activity: Sufficiently Active (07/07/2023)   Received from [facility]   Exercise Vital Sign    On average, how many days per week do you engage in moderate to strenuous exercise (like a brisk walk)?: 5 days    On average, how many minutes do you engage in exercise at this level?: 50 min  Stress: Stress Concern Present (07/07/2023)   Received from [facility] - Occupational Stress Questionnaire    Feeling of Stress : Very much  Social Connections: Moderately Integrated (07/07/2023)   Received from [facility]   Social Network    How would you rate your social network (family, work, friends)?: Adequate participation with social networks  Intimate Partner Violence: Not At Risk (07/07/2023)   Received from [facility]   HITS    Over the last 12 months how  often did your partner physically hurt you?: Never    Over the last 12 months how often did your partner insult you or talk down to you?: Never    Over the last 12 months how often did your partner threaten you with physical harm?: Never    Over the last 12 months how often did your partner scream or curse at you?: Never    Family History  Problem Relation Age of Onset   Colon polyps Mother    Kidney cancer Mother    COPD Mother    Diverticulitis Father    Colon polyps Father    Dysphagia Father    Heart disease Father    Diabetes Father    Other Sister        Hypoglycemia   Diabetes Brother    Diabetes Brother    Diabetes Brother    Colon cancer Neg Hx    Pancreatic cancer Neg Hx    Stomach cancer Neg Hx    Liver disease Neg Hx    Esophageal cancer Neg Hx    Rectal cancer Neg Hx    I have reviewed her medical, social, and family history in detail and updated the electronic medical record as necessary.    PHYSICAL EXAMINATION  BP 120/62   Pulse 68   Ht 5' 3 (1.6 m)   Wt 191 lb (86.6 kg)   BMI 33.83 kg/m  Wt Readings from Last 3 Encounters:  07/13/23 191 lb (86.6 kg)  06/21/20 177 lb (80.3 kg)  05/07/20 181 lb 8 oz (82.3 kg)  GEN: NAD, appears younger than stated age, doesn't appear chronically ill PSYCH: Cooperative, without pressured speech EYE: Conjunctivae pink, sclerae anicteric ENT: Masked CV: Nontachycardic RESP: No audible wheezing GI: NABS, soft, NT/ND, without rebound or guarding, no HSM appreciated MSK/EXT: Lower extremity edema present bilaterally SKIN: No jaundice NEURO:  Alert & Oriented x 3, no focal deficits   REVIEW OF DATA  I reviewed the following data at the time of this encounter:  GI Procedures and Studies  February 2022 EGD - No gross lesions in esophagus. Z-line regular, 34 cm from the incisors. - Acquired deformity of proximal stomach - most likely hiatal hernia but J-shaped stomach could be etiology as well. - Gastritis. Biopsied. - No gross lesions in the duodenal bulb, in the first portion of the duodenum and in the second portion of the duodenum. Biopsied.  February 2022 Colonoscopy - Hemorrhoids found on digital rectal exam. - Seven 3 to 10 mm polyps in the descending colon, in the transverse colon and in the ascending colon, removed with a  cold snare. Resected and retrieved. - Diverticulosis in the sigmoid colon, in the descending colon, in the transverse colon and in the ascending colon. - Normal mucosa in the entire examined colon otherwise. - Non-bleeding non-thrombosed internal hemorrhoids.  Pathology Diagnosis 1. Surgical [P], duodenal bx - DUODENAL MUCOSA WITH NO SIGNIFICANT PATHOLOGIC FINDINGS. - NEGATIVE FOR INCREASED INTRAEPITHELIAL LYMPHOCYTES AND VILLOUS ARCHITECTURAL CHANGES. 2. Surgical [P], gastric - MILD CHRONIC GASTRITIS. - WARTHIN-STARRY STAIN IS NEGATIVE FOR HELICOBACTER PYLORI 3. Surgical [P], colon, ascending x5, transverse x1, descending x1, polyp (7) - TUBULAR ADENOMA (X MULTIPLE). - NEGATIVE FOR HIGH GRADE DYSPLASIA.  Laboratory Studies  Reviewed those in [facility]  Imaging Studies  3/22 CXR IMPRESSION: No active cardiopulmonary disease. Stable large hiatal hernia.   ASSESSMENT  Ms. Maskell is a 78 y.o. female with a pmh significant for hypertension, asthma/COPD, seasonal allergies, anxiety, prior IDA, status  post cholecystectomy, GERD, large hiatal hernia, colon polyps (TAs). The patient is seen today for evaluation and management of:  No diagnosis found.  The patient seems to be hemodynamically stable.  Clinically she is also stable although she is having more significant diarrheal symptoms than were initially understood.  At this point further work-up is recommended.  I am going to try to bulk her stools significantly to see if that makes any difference in her symptoms first.  If not we will trial cholestyramine.  If that also does not work and we will out exocrine pancreas insufficiency as well as SIBO then we will need to consider a flexible sigmoidoscopy for biopsies to be obtained to rule out microscopic/collagenous colitis.  We will see how she does over the next 6 weeks and she will MyChart us  and then let us  know about follow-up in approximately 3 to 4 months or sooner if she is not making  headway.  All patient questions were answered to the best of my ability, and the patient agrees to the aforementioned plan of action with follow-up as indicated.   PLAN  Continue current PPI dosing Patient may have laboratories checked to ensure stability of blood counts and iron indices in the course of the coming months Metamucil once to twice daily MyChart in 6 weeks to let us  know how she is doing Fecal elastase to be obtained SIBO breath testing will be considered Diagnostic sigmoidoscopy with biopsies may be considered in future Suspect symptoms are more likely IBS however Colonoscopy for surveillance in 2025   No orders of the defined types were placed in this encounter.   New Prescriptions   No medications on file   Modified Medications   No medications on file    Planned Follow Up No follow-ups on file.   Total Time in Face-to-Face and in Coordination of Care for patient including independent/personal interpretation/review of prior testing, medical history, examination, medication adjustment, communicating results with the patient directly, and documentation with the EHR is 25 minutes.  Yong Henle, MD [facility] Office # [phone]

## 2023-07-14 ENCOUNTER — Ambulatory Visit: Payer: Self-pay | Admitting: Gastroenterology

## 2023-07-14 ENCOUNTER — Encounter: Payer: Self-pay | Admitting: Gastroenterology

## 2023-07-14 DIAGNOSIS — Z862 Personal history of diseases of the blood and blood-forming organs and certain disorders involving the immune mechanism: Secondary | ICD-10-CM | POA: Insufficient documentation

## 2023-07-17 DIAGNOSIS — R4701 Aphasia: Secondary | ICD-10-CM | POA: Diagnosis not present

## 2023-07-17 DIAGNOSIS — F985 Adult onset fluency disorder: Secondary | ICD-10-CM | POA: Diagnosis not present

## 2023-07-17 DIAGNOSIS — F419 Anxiety disorder, unspecified: Secondary | ICD-10-CM | POA: Diagnosis not present

## 2023-07-18 MED ORDER — IRON 325 (65 FE) MG PO TABS: 325.0000 mg | ORAL_TABLET | Freq: Two times a day (BID) | ORAL | Status: AC

## 2023-07-31 DIAGNOSIS — F985 Adult onset fluency disorder: Secondary | ICD-10-CM | POA: Diagnosis not present

## 2023-08-07 DIAGNOSIS — R350 Frequency of micturition: Secondary | ICD-10-CM | POA: Diagnosis not present

## 2023-08-07 DIAGNOSIS — R3915 Urgency of urination: Secondary | ICD-10-CM | POA: Diagnosis not present

## 2023-08-07 DIAGNOSIS — N390 Urinary tract infection, site not specified: Secondary | ICD-10-CM | POA: Diagnosis not present

## 2023-08-09 DIAGNOSIS — F985 Adult onset fluency disorder: Secondary | ICD-10-CM | POA: Diagnosis not present

## 2023-08-15 ENCOUNTER — Encounter: Payer: Self-pay | Admitting: Gastroenterology

## 2023-08-15 DIAGNOSIS — D649 Anemia, unspecified: Secondary | ICD-10-CM | POA: Diagnosis not present

## 2023-08-15 DIAGNOSIS — I129 Hypertensive chronic kidney disease with stage 1 through stage 4 chronic kidney disease, or unspecified chronic kidney disease: Secondary | ICD-10-CM | POA: Diagnosis not present

## 2023-08-15 DIAGNOSIS — M810 Age-related osteoporosis without current pathological fracture: Secondary | ICD-10-CM | POA: Diagnosis not present

## 2023-08-15 DIAGNOSIS — F419 Anxiety disorder, unspecified: Secondary | ICD-10-CM | POA: Diagnosis not present

## 2023-08-15 DIAGNOSIS — F0781 Postconcussional syndrome: Secondary | ICD-10-CM | POA: Diagnosis not present

## 2023-08-15 DIAGNOSIS — Z860101 Personal history of adenomatous and serrated colon polyps: Secondary | ICD-10-CM | POA: Diagnosis not present

## 2023-08-15 DIAGNOSIS — N1832 Chronic kidney disease, stage 3b: Secondary | ICD-10-CM | POA: Diagnosis not present

## 2023-08-15 DIAGNOSIS — F331 Major depressive disorder, recurrent, moderate: Secondary | ICD-10-CM | POA: Diagnosis not present

## 2023-08-16 DIAGNOSIS — R269 Unspecified abnormalities of gait and mobility: Secondary | ICD-10-CM | POA: Diagnosis not present

## 2023-08-16 DIAGNOSIS — F985 Adult onset fluency disorder: Secondary | ICD-10-CM | POA: Diagnosis not present

## 2023-08-16 DIAGNOSIS — R2689 Other abnormalities of gait and mobility: Secondary | ICD-10-CM | POA: Diagnosis not present

## 2023-08-17 ENCOUNTER — Telehealth (HOSPITAL_COMMUNITY): Payer: Self-pay | Admitting: Pharmacy Technician

## 2023-08-17 NOTE — Telephone Encounter (Signed)
 Auth Submission: NO AUTH NEEDED Site of care: Site of care: [facility] Payer: [employer] Adv Medication & CPT/J Code(s) submitted: Reclast  (Zolendronic acid) J3489 Diagnosis Code: M81.0 Route of submission (phone, fax, portal):  Phone # Fax # Auth type: Buy/Bill HB Units/visits requested: 5mg  x 1 dose Reference number: Approval from: 08/17/23 to 01/30/24   Dagoberto Armour, CPhT [facility] Phone: [phone] 08/17/2023

## 2023-08-20 ENCOUNTER — Other Ambulatory Visit: Payer: Self-pay | Admitting: Internal Medicine

## 2023-08-20 DIAGNOSIS — Z1231 Encounter for screening mammogram for malignant neoplasm of breast: Secondary | ICD-10-CM

## 2023-08-22 ENCOUNTER — Ambulatory Visit: Admitting: Gastroenterology

## 2023-08-22 ENCOUNTER — Encounter: Payer: Self-pay | Admitting: Gastroenterology

## 2023-08-22 VITALS — BP 128/65 | HR 63 | Temp 98.0°F | Resp 23 | Ht 63.0 in | Wt 191.0 lb

## 2023-08-22 DIAGNOSIS — J45909 Unspecified asthma, uncomplicated: Secondary | ICD-10-CM | POA: Diagnosis not present

## 2023-08-22 DIAGNOSIS — Z862 Personal history of diseases of the blood and blood-forming organs and certain disorders involving the immune mechanism: Secondary | ICD-10-CM | POA: Diagnosis not present

## 2023-08-22 DIAGNOSIS — K641 Second degree hemorrhoids: Secondary | ICD-10-CM | POA: Diagnosis not present

## 2023-08-22 DIAGNOSIS — K222 Esophageal obstruction: Secondary | ICD-10-CM

## 2023-08-22 DIAGNOSIS — D123 Benign neoplasm of transverse colon: Secondary | ICD-10-CM

## 2023-08-22 DIAGNOSIS — K5730 Diverticulosis of large intestine without perforation or abscess without bleeding: Secondary | ICD-10-CM

## 2023-08-22 DIAGNOSIS — K644 Residual hemorrhoidal skin tags: Secondary | ICD-10-CM | POA: Diagnosis not present

## 2023-08-22 DIAGNOSIS — K229 Disease of esophagus, unspecified: Secondary | ICD-10-CM | POA: Diagnosis not present

## 2023-08-22 DIAGNOSIS — D122 Benign neoplasm of ascending colon: Secondary | ICD-10-CM

## 2023-08-22 DIAGNOSIS — K3189 Other diseases of stomach and duodenum: Secondary | ICD-10-CM | POA: Diagnosis not present

## 2023-08-22 DIAGNOSIS — Z860101 Personal history of adenomatous and serrated colon polyps: Secondary | ICD-10-CM | POA: Diagnosis not present

## 2023-08-22 DIAGNOSIS — K319 Disease of stomach and duodenum, unspecified: Secondary | ICD-10-CM | POA: Diagnosis not present

## 2023-08-22 DIAGNOSIS — Z1211 Encounter for screening for malignant neoplasm of colon: Secondary | ICD-10-CM

## 2023-08-22 DIAGNOSIS — K449 Diaphragmatic hernia without obstruction or gangrene: Secondary | ICD-10-CM | POA: Diagnosis not present

## 2023-08-22 DIAGNOSIS — F419 Anxiety disorder, unspecified: Secondary | ICD-10-CM | POA: Diagnosis not present

## 2023-08-22 MED ORDER — SODIUM CHLORIDE 0.9 % IV SOLN: 500.0000 mL | Freq: Once | INTRAVENOUS | Status: DC

## 2023-08-22 NOTE — Progress Notes (Signed)
 A/o x 3, VSS, gd SR's, pleased with anesthesia, report to RN

## 2023-08-22 NOTE — Progress Notes (Signed)
 GASTROENTEROLOGY PROCEDURE H&P NOTE   Primary Care Physician: Stephane Leita DEL, MD  HPI: [name] is a 78 y.o. female who presents for EGD/Colonoscopy for further evaluation iron  deficiency anemia with previous history of adenomatous colon polyps for surveillance.  Past Medical History:  Diagnosis Date   Allergy    Hay fever/bee stings   Anemia    Anxiety    Arthritis    Asthma    Atypical mole 04/08/2020   Right Antecubital Fossa (moderate)   Atypical mole 08/03/2021   Right Upper Back (moderate)   Atypical nevi 12/02/2005   MID UPPER BACK SLIGHT TO MODERATE   Atypical nevi 10/31/2005   UPPER LEFT BACK MILD   Atypical nevi 10/31/2005   UPPER MID BACK MODERATE MARKED TX EXC   Atypical nevi 10/31/2005   LOWER MID BACK SLIGHT   Atypical nevi 10/31/2005   RIGHT UPPER ARM SLIGHT   Atypical nevi 07/07/2008   RIGHT FOREARM MILD   Atypical nevi 01/05/2009   LEFT INNER SHIN SLIGHT /MODERATE   Atypical nevi 01/13/2009   LEFT WRIST SLIGHT MODERATE   Atypical nevi 04/05/2011   UPPER BACK MILD/MOD.   Atypical nevi 04/05/2011   LEFT UPPER ARM MOD.   Atypical nevi 04/05/2011   LEFT OUTER THIGH MILD   Atypical nevi 04/05/2011   LEFT BUTTOCK MILD   Atypical nevi 04/05/2011   RIGHT SHIN MODERATE   Atypical nevi 10/06/2012   LEFT LEG MILD   Atypical nevi 10/06/2012   RIGHT UPPER ARM MILD   Atypical nevi 10/06/2012   UPPER MID BACK MILD   Atypical nevi 10/06/2012   RIGHT LOWER BACK MILD   Atypical nevi 10/06/2012   BACK OF LEFT LEG MODERATE   Atypical nevi 03/11/2013   LEFT OUTER BREAST MILD   Atypical nevi 03/11/2013   RIGHT POST KNEE MILD   Atypical nevi 03/11/2013   LOWER RIGHT SHOULDER MILD   Atypical nevi 03/11/2013   RIGHT CHEEK MODERATE TX WIDER SHAVE X2   Atypical nevi 09/08/2014   RIGHT LATERAL THIGH MILD   Atypical nevi 09/08/2014   RIGHT POST SHOULDER MILD   Atypical nevi 12/26/2016   LEFT TRAPEZIUS MILD   Atypical nevus 12/26/2016   LEFT  THIGH MILD   Atypical nevus 06/06/2017   RIGHT FOREARM MILD   Atypical nevus 10/16/2018   LEFT MID BACK MOD.   Atypical nevus 10/16/2018   LEFT ARM MODERATE TX WIDER SHAVE FREE MARGIN   Atypical nevus 10/16/2018   RIGHT UPPER ARM MILD   Atypical nevus 10/16/2018   RIGHT ELBOW MODERATE   Blood transfusion without reported diagnosis 2012   Cataract    Depression    GERD (gastroesophageal reflux disease)    Heart murmur    History of kidney stones    Hypertension    Melanoma (HCC) 12/02/2004   LEFT TRAPEZIUS TX EXC   Osteoporosis    Pneumonia    hx of    Post concussion syndrome    Past Surgical History:  Procedure Laterality Date   ABDOMINAL HYSTERECTOMY  1986   CATARACT EXTRACTION, BILATERAL  2020   CHOLECYSTECTOMY  12/2019   COLONOSCOPY     MELANOMA EXCISION  2009   left shoulder near neck   REPLACEMENT TOTAL KNEE BILATERAL  05/2010 &05/2010   Current Outpatient Medications  Medication Sig Dispense Refill   Calcium  Carb-Cholecalciferol (CALCIUM  600 + D PO) Take 1 tablet by mouth 2 (two) times daily.     clonazePAM (KLONOPIN)  0.5 MG tablet Take 0.5 mg by mouth 2 (two) times daily as needed for anxiety.      cyanocobalamin  1000 MCG tablet Take by mouth.     DULoxetine (CYMBALTA) 60 MG capsule Take 60 mg by mouth daily.     fexofenadine (ALLEGRA) 180 MG tablet Take 180 mg by mouth daily.     FLUoxetine (PROZAC) 40 MG capsule Take 40 mg by mouth Daily.      L-Theanine 100 MG CAPS Take 100 mg by mouth at bedtime as needed (sleep).     losartan (COZAAR) 100 MG tablet Take 100 mg by mouth Daily.      Melatonin 10 MG TABS Take 10 mg by mouth at bedtime as needed (sleep).     Omeprazole 20 MG TBEC Take 20 mg by mouth Daily.      Polyethyl Glycol-Propyl Glycol (SYSTANE OP) Place 1 drop into both eyes daily as needed (dry eyes).     traMADol -acetaminophen  (ULTRACET ) 37.5-325 MG tablet Take 2 tablets by mouth 3 (three) times daily as needed.     Vitamin D, Ergocalciferol, (DRISDOL)  1.25 MG (50000 UNIT) CAPS capsule TAKE 1 CAPSULE BY MOUTH 1 TIME A WEEK     albuterol  (PROVENTIL  HFA;VENTOLIN  HFA) 108 (90 BASE) MCG/ACT inhaler Inhale 2 puffs into the lungs every 6 (six) hours as needed for wheezing or shortness of breath.      clobetasol  cream (TEMOVATE ) 0.05 % Apply 1 application topically daily. psoriasis 60 g 11   diclofenac sodium (VOLTAREN) 1 % GEL Apply 1 application topically 4 (four) times daily as needed (pain).      EPINEPHrine  0.1 MG/0.1ML SOAJ as directed Injection     EPINEPHrine  0.3 mg/0.3 mL IJ SOAJ injection Inject 0.6 mg into the muscle as needed for anaphylaxis.     Ferrous Sulfate (IRON ) 325 (65 Fe) MG TABS Take 1 tablet (325 mg total) by mouth 2 (two) times daily. Take with OJ or vitamin C     fluticasone (FLONASE) 50 MCG/ACT nasal spray one spray by Both Nostrils route daily.     ibuprofen (ADVIL) 200 MG tablet Take 400 mg by mouth 2 (two) times daily as needed for moderate pain.     NONFORMULARY OR COMPOUNDED ITEM Fluconazole  200mg  crush tabs in 30ml DMSO # 2oz 1-2 Drops Under The Nail 2 each 2   salmeterol (SEREVENT) 50 MCG/DOSE diskus inhaler Inhale 1 puff into the lungs 2 (two) times daily as needed (shortness of breath).     traZODone (DESYREL) 100 MG tablet Take 100 mg by mouth at bedtime as needed for sleep.     Current Facility-Administered Medications  Medication Dose Route Frequency Provider Last Rate Last Admin   0.9 %  sodium chloride  infusion  500 mL Intravenous Once Mansouraty, Kenniel Bergsma Jr., MD        Current Outpatient Medications:    Calcium  Carb-Cholecalciferol (CALCIUM  600 + D PO), Take 1 tablet by mouth 2 (two) times daily., Disp: , Rfl:    clonazePAM (KLONOPIN) 0.5 MG tablet, Take 0.5 mg by mouth 2 (two) times daily as needed for anxiety. , Disp: , Rfl:    cyanocobalamin  1000 MCG tablet, Take by mouth., Disp: , Rfl:    DULoxetine (CYMBALTA) 60 MG capsule, Take 60 mg by mouth daily., Disp: , Rfl:    fexofenadine (ALLEGRA) 180 MG  tablet, Take 180 mg by mouth daily., Disp: , Rfl:    FLUoxetine (PROZAC) 40 MG capsule, Take 40 mg by mouth Daily. , Disp: ,  Rfl:    L-Theanine 100 MG CAPS, Take 100 mg by mouth at bedtime as needed (sleep)., Disp: , Rfl:    losartan (COZAAR) 100 MG tablet, Take 100 mg by mouth Daily. , Disp: , Rfl:    Melatonin 10 MG TABS, Take 10 mg by mouth at bedtime as needed (sleep)., Disp: , Rfl:    Omeprazole 20 MG TBEC, Take 20 mg by mouth Daily. , Disp: , Rfl:    Polyethyl Glycol-Propyl Glycol (SYSTANE OP), Place 1 drop into both eyes daily as needed (dry eyes)., Disp: , Rfl:    traMADol -acetaminophen  (ULTRACET ) 37.5-325 MG tablet, Take 2 tablets by mouth 3 (three) times daily as needed., Disp: , Rfl:    Vitamin D, Ergocalciferol, (DRISDOL) 1.25 MG (50000 UNIT) CAPS capsule, TAKE 1 CAPSULE BY MOUTH 1 TIME A WEEK, Disp: , Rfl:    albuterol  (PROVENTIL  HFA;VENTOLIN  HFA) 108 (90 BASE) MCG/ACT inhaler, Inhale 2 puffs into the lungs every 6 (six) hours as needed for wheezing or shortness of breath. , Disp: , Rfl:    clobetasol  cream (TEMOVATE ) 0.05 %, Apply 1 application topically daily. psoriasis, Disp: 60 g, Rfl: 11   diclofenac sodium (VOLTAREN) 1 % GEL, Apply 1 application topically 4 (four) times daily as needed (pain). , Disp: , Rfl:    EPINEPHrine  0.1 MG/0.1ML SOAJ, as directed Injection, Disp: , Rfl:    EPINEPHrine  0.3 mg/0.3 mL IJ SOAJ injection, Inject 0.6 mg into the muscle as needed for anaphylaxis., Disp: , Rfl:    Ferrous Sulfate (IRON ) 325 (65 Fe) MG TABS, Take 1 tablet (325 mg total) by mouth 2 (two) times daily. Take with OJ or vitamin C, Disp: , Rfl:    fluticasone (FLONASE) 50 MCG/ACT nasal spray, one spray by Both Nostrils route daily., Disp: , Rfl:    ibuprofen (ADVIL) 200 MG tablet, Take 400 mg by mouth 2 (two) times daily as needed for moderate pain., Disp: , Rfl:    NONFORMULARY OR COMPOUNDED ITEM, Fluconazole  200mg  crush tabs in 30ml DMSO # 2oz 1-2 Drops Under The Nail, Disp: 2 each,  Rfl: 2   salmeterol (SEREVENT) 50 MCG/DOSE diskus inhaler, Inhale 1 puff into the lungs 2 (two) times daily as needed (shortness of breath)., Disp: , Rfl:    traZODone (DESYREL) 100 MG tablet, Take 100 mg by mouth at bedtime as needed for sleep., Disp: , Rfl:   Current Facility-Administered Medications:    0.9 %  sodium chloride  infusion, 500 mL, Intravenous, Once, Mansouraty, Aloha Raddle., MD Allergies  Allergen Reactions   Other Anaphylaxis    Pt allergic to any kind of bee, wasp, jellow jacket, etc   Wasp Venom Protein Anaphylaxis   Family History  Problem Relation Age of Onset   Colon polyps Mother    Kidney cancer Mother    COPD Mother    Diverticulitis Father    Colon polyps Father    Dysphagia Father    Heart disease Father    Diabetes Father    Other Sister        Hypoglycemia   Diabetes Brother    Diabetes Brother    Diabetes Brother    Colon cancer Neg Hx    Pancreatic cancer Neg Hx    Stomach cancer Neg Hx    Liver disease Neg Hx    Esophageal cancer Neg Hx    Rectal cancer Neg Hx    Inflammatory bowel disease Neg Hx    Social History   Socioeconomic History   Marital  status: Single    Spouse name: Not on file   Number of children: Not on file   Years of education: Not on file   Highest education level: Not on file  Occupational History   Not on file  Tobacco Use   Smoking status: Never   Smokeless tobacco: Never  Vaping Use   Vaping status: Never Used  Substance and Sexual Activity   Alcohol use: No   Drug use: No   Sexual activity: Not Currently  Other Topics Concern   Not on file  Social History Narrative   Not on file   Social Drivers of Health   Financial Resource Strain: Low Risk  (07/07/2023)   Received from [facility]   Overall Financial Resource Strain (CARDIA)    Difficulty of Paying Living Expenses: Not very hard  Food Insecurity: No Food Insecurity (07/07/2023)   Received from [facility]   Hunger Vital Sign    Within the past  12 months, you worried that your food would run out before you got the money to buy more.: Never true    Within the past 12 months, the food you bought just didn't last and you didn't have money to get more.: Never true  Transportation Needs: No Transportation Needs (07/07/2023)   Received from [facility] - Transportation    Lack of Transportation (Medical): No    Lack of Transportation (Non-Medical): No  Physical Activity: Sufficiently Active (07/07/2023)   Received from [facility]   Exercise Vital Sign    On average, how many days per week do you engage in moderate to strenuous exercise (like a brisk walk)?: 5 days    On average, how many minutes do you engage in exercise at this level?: 50 min  Stress: Stress Concern Present (07/07/2023)   Received from [facility] - Occupational Stress Questionnaire    Feeling of Stress : Very much  Social Connections: Moderately Integrated (07/07/2023)   Received from [facility]   Social Network    How would you rate your social network (family, work, friends)?: Adequate participation with social networks  Intimate Partner Violence: Not At Risk (07/07/2023)   Received from [facility]   HITS    Over the last 12 months how often did your partner physically hurt you?: Never    Over the last 12 months how often did your partner insult you or talk down to you?: Never    Over the last 12 months how often did your partner threaten you with physical harm?: Never    Over the last 12 months how often did your partner scream or curse at you?: Never    Physical Exam: Today's Vitals   08/22/23 1238  BP: (!) 160/87  Pulse: 80  Temp: 98 F (36.7 C)  SpO2: 97%  Weight: 191 lb (86.6 kg)  Height: 5' 3 (1.6 m)   Body mass index is 33.83 kg/m. GEN: NAD EYE: Sclerae anicteric ENT: MMM CV: Non-tachycardic GI: Soft, NT/ND NEURO:  Alert & Oriented x 3  Lab Results: No results for input(s):  WBC, HGB, HCT, PLT in the last 72 hours. BMET No results for input(s): NA, K, CL, CO2, GLUCOSE, BUN, CREATININE, CALCIUM  in the last 72 hours. LFT No results for input(s): PROT, ALBUMIN, AST, ALT, ALKPHOS, BILITOT, BILIDIR, IBILI in the last 72 hours. PT/INR No results for input(s): LABPROT, INR in the last 72 hours.   Impression / Plan:  This is a 78 y.o.female who presents for EGD/Colonoscopy for further evaluation iron  deficiency anemia with previous history of adenomatous colon polyps for surveillance.  The risks and benefits of endoscopic evaluation/treatment were discussed with the patient and/or family; these include but are not limited to the risk of perforation, infection, bleeding, missed lesions, lack of diagnosis, severe illness requiring hospitalization, as well as anesthesia and sedation related illnesses.  The patient's history has been reviewed, patient examined, no change in status, and deemed stable for procedure.  The patient and/or family is agreeable to proceed.    Aloha Finner, MD  Gastroenterology Advanced Endoscopy Office # [phone]

## 2023-08-22 NOTE — Progress Notes (Signed)
 Called to room to assist during endoscopic procedure.  Patient ID and intended procedure confirmed with present staff. Received instructions for my participation in the procedure from the performing physician.

## 2023-08-22 NOTE — Op Note (Signed)
 [facility] Patient Name: [name] Procedure Date: 08/22/2023 12:52 PM MRN: [id number] Endoscopist: Aloha Finner , MD, [id number] Age: 78 Referring MD:  Date of Birth: Mar 28, 1945 Gender: Female Account #: [id number] Procedure:                Upper GI endoscopy Indications:              Iron  deficiency anemia, Heartburn, Hiatal hernia Medicines:                Monitored Anesthesia Care Procedure:                Pre-Anesthesia Assessment:                           - Prior to the procedure, a History and Physical                            was performed, and patient medications and                            allergies were reviewed. The patient's tolerance of                            previous anesthesia was also reviewed. The risks                            and benefits of the procedure and the sedation                            options and risks were discussed with the patient.                            All questions were answered, and informed consent                            was obtained. Prior Anticoagulants: The patient has                            taken no anticoagulant or antiplatelet agents. ASA                            Grade Assessment: III - A patient with severe                            systemic disease. After reviewing the risks and                            benefits, the patient was deemed in satisfactory                            condition to undergo the procedure.                           After obtaining informed consent, the endoscope was  passed under direct vision. Throughout the                            procedure, the patient's blood pressure, pulse, and                            oxygen saturations were monitored continuously. The                            Olympus Scope [id number] was introduced through the                            mouth, and advanced to the second part of duodenum.                             The upper GI endoscopy was accomplished without                            difficulty. The patient tolerated the procedure. Scope In: Scope Out: Findings:                 Diffuse mild mucosal changes characterized by                            congestion and altered texture were found in the                            entire esophagus. Biopsies were taken with a cold                            forceps for histology to rule out EoE/LoE.                           A non-obstructing Schatzki ring was found at the                            gastroesophageal junction.                           The Z-line was irregular and was found 32 cm from                            the incisors.                           A 6 cm hiatal hernia was present (paraesophageal                            and sliding components).                           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                           Striped mildly erythematous mucosa without bleeding  was found in the gastric antrum.                           No other gross lesions were noted in the entire                            examined stomach. Biopsies were taken with a cold                            forceps for histology and Helicobacter pylori                            testing.                           No gross lesions were noted in the duodenal bulb,                            in the first portion of the duodenum and in the                            second portion of the duodenum. Biopsies for                            histology were taken with a cold forceps for                            evaluation of celiac disease. Complications:            No immediate complications. Estimated Blood Loss:     Estimated blood loss was minimal. Impression:               - Congested, texture changed mucosa in the                            esophagus. Biopsied.                           -  Non-obstructing Schatzki ring.                           - Z-line irregular, 32 cm from the incisors.                           - 6 cm hiatal hernia.                           - J-shaped deformity in the entire stomach.                           - Erythematous mucosa in the antrum. No other gross                            lesions in the entire stomach. Biopsied.                           -  No gross lesions in the duodenal bulb, in the                            first portion of the duodenum and in the second                            portion of the duodenum. Biopsied. Recommendation:           - Proceed to scheduled colonoscopy.                           - Observe patient's clinical course.                           - Await pathology results.                           - The findings and recommendations were discussed                            with the patient.                           - The findings and recommendations were discussed                            with the patient's family. Aloha Finner, MD 08/22/2023 2:30:29 PM

## 2023-08-22 NOTE — Op Note (Signed)
 [facility] Patient Name: Hoang Reich Procedure Date: 08/22/2023 12:52 PM MRN: [id number] Endoscopist: Aloha Finner , MD, [id number] Age: 78 Referring MD:  Date of Birth: 1945-10-05 Gender: Female Account #: [id number] Procedure:                Colonoscopy Indications:              Surveillance: Personal history of adenomatous                            polyps on last colonoscopy 3 years ago, Incidental                            - Iron  deficiency anemia Medicines:                Monitored Anesthesia Care Procedure:                Pre-Anesthesia Assessment:                           - Prior to the procedure, a History and Physical                            was performed, and patient medications and                            allergies were reviewed. The patient's tolerance of                            previous anesthesia was also reviewed. The risks                            and benefits of the procedure and the sedation                            options and risks were discussed with the patient.                            All questions were answered, and informed consent                            was obtained. Prior Anticoagulants: The patient has                            taken no anticoagulant or antiplatelet agents. ASA                            Grade Assessment: III - A patient with severe                            systemic disease. After reviewing the risks and                            benefits, the patient was deemed in satisfactory  condition to undergo the procedure.                           After obtaining informed consent, the colonoscope                            was passed under direct vision. Throughout the                            procedure, the patient's blood pressure, pulse, and                            oxygen saturations were monitored continuously. The                            Olympus CF-HQ190L  ([id number]) Colonoscope was                            introduced through the anus and advanced to the 3                            cm into the ileum. The colonoscopy was performed                            without difficulty. The patient tolerated the                            procedure. The quality of the bowel preparation was                            good. The terminal ileum, ileocecal valve,                            appendiceal orifice, and rectum were photographed. Scope In: 2:04:19 PM Scope Out: 2:17:11 PM Scope Withdrawal Time: 0 hours 10 minutes 0 seconds  Total Procedure Duration: 0 hours 12 minutes 52 seconds  Findings:                 The digital rectal exam findings include                            hemorrhoids. Pertinent negatives include no                            palpable rectal lesions.                           The left colon was mildly tortuous.                           The terminal ileum and ileocecal valve appeared                            normal.  Three sessile polyps were found in the transverse                            colon and ascending colon. The polyps were 3 to 5                            mm in size. These polyps were removed with a cold                            snare. Resection and retrieval were complete.                           Multiple small-mouthed diverticula were found in                            the recto-sigmoid colon and sigmoid colon.                           Normal mucosa was found in the entire colon.                           Non-bleeding non-thrombosed external and internal                            hemorrhoids were found during retroflexion, during                            perianal exam and during digital exam. The                            hemorrhoids were Grade II (internal hemorrhoids                            that prolapse but reduce spontaneously). Complications:            No immediate  complications. Estimated Blood Loss:     Estimated blood loss was minimal. Impression:               - Hemorrhoids found on digital rectal exam.                           - Tortuous colon.                           - The examined portion of the ileum was normal.                           - Three 3 to 5 mm polyps in the transverse colon                            and in the ascending colon, removed with a cold                            snare. Resected and retrieved.                           -  Diverticulosis in the recto-sigmoid colon and in                            the sigmoid colon.                           - Normal mucosa in the entire examined colon.                           - Non-bleeding non-thrombosed external and internal                            hemorrhoids. Recommendation:           - The patient will be observed post-procedure,                            until all discharge criteria are met.                           - Discharge patient to home.                           - Patient has a contact number available for                            emergencies. The signs and symptoms of potential                            delayed complications were discussed with the                            patient. Return to normal activities tomorrow.                            Written discharge instructions were provided to the                            patient.                           - High fiber diet.                           - Use FiberCon 1-2 tablets PO daily.                           - Continue present medications.                           - Await pathology results.                           - Repeat colonoscopy in 3 years for surveillance.                            Patient will be 78 years of age  at that time, she                            would need to remain in excellent health for us  to                            consider further surveillance otherwise this may be                             her last colonoscopy.                           - Recommend repeat iron  indices and blood count in                            2 months. If these remain abnormal with iron                             deficiency still being present, recommend video                            capsule endoscopy be considered strongly. Her large                            sliding/paraesophageal hernia may make passage of                            the video capsule endoscope a bit more tricky                            initially, but patient denies issues of dysphagia                            or significant abdominal pain or early satiety, so                            hopefully it would be able to traverse the area and                            evaluate the small bowel further. If issues, could                            consider endoscopically placed capsule in future.                           - The findings and recommendations were discussed                            with the patient.                           - The findings and recommendations were discussed  with the patient's family. Aloha Finner, MD 08/22/2023 2:35:09 PM

## 2023-08-22 NOTE — Patient Instructions (Addendum)
 - High fiber diet. - Use FiberCon 1-2 tablets PO daily. - Continue present medications. - Await pathology results. - Repeat colonoscopy in 3 years for surveillance. Patient will be 78 years of age at that time, she would need to remain in excellent health for us  to consider further surveillance otherwise this may be her last colonoscopy. - Recommend repeat iron  indices and blood count in 2 months. If these remain abnormal with iron  deficiency still being present, recommend video capsule endoscopy be considered strongly.  Resume previous diet Continue present medications Await pathology results  Handouts/information given for polyps, diverticulosis and hemorrhoids   YOU HAD AN ENDOSCOPIC PROCEDURE TODAY AT THE  ENDOSCOPY CENTER:   Refer to the procedure report that was given to you for any specific questions about what was found during the examination.  If the procedure report does not answer your questions, please call your gastroenterologist to clarify.  If you requested that your care partner not be given the details of your procedure findings, then the procedure report has been included in a sealed envelope for you to review at your convenience later.  YOU SHOULD EXPECT: Some feelings of bloating in the abdomen. Passage of more gas than usual.  Walking can help get rid of the air that was put into your GI tract during the procedure and reduce the bloating. If you had a lower endoscopy (such as a colonoscopy or flexible sigmoidoscopy) you may notice spotting of blood in your stool or on the toilet paper. If you underwent a bowel prep for your procedure, you may not have a normal bowel movement for a few days.  Please Note:  You might notice some irritation and congestion in your nose or some drainage.  This is from the oxygen used during your procedure.  There is no need for concern and it should clear up in a day or so.  SYMPTOMS TO REPORT IMMEDIATELY:  Following lower endoscopy  (colonoscopy or flexible sigmoidoscopy):  Excessive amounts of blood in the stool  Significant tenderness or worsening of abdominal pains  Swelling of the abdomen that is new, acute  Fever of 100F or higher  Following upper endoscopy (EGD)  Vomiting of blood or coffee ground material  New chest pain or pain under the shoulder blades  Painful or persistently difficult swallowing  New shortness of breath  Fever of 100F or higher  Black, tarry-looking stools  For urgent or emergent issues, a gastroenterologist can be reached at any hour by calling [phone]. Do not use MyChart messaging for urgent concerns.    DIET:  We do recommend a small meal at first, but then you may proceed to your regular diet.  Drink plenty of fluids but you should avoid alcoholic beverages for 24 hours.  ACTIVITY:  You should plan to take it easy for the rest of today and you should NOT DRIVE or use heavy machinery until tomorrow (because of the sedation medicines used during the test).    FOLLOW UP: Our staff will call the number listed on your records the next business day following your procedure.  We will call around 7:15- 8:00 am to check on you and address any questions or concerns that you may have regarding the information given to you following your procedure. If we do not reach you, we will leave a message.     If any biopsies were taken you will be contacted by phone or by letter within the next 1-3 weeks.  Please call  us  at [phone] if you have not heard about the biopsies in 3 weeks.    SIGNATURES/CONFIDENTIALITY: You and/or your care partner have signed paperwork which will be entered into your electronic medical record.  These signatures attest to the fact that that the information above on your After Visit Summary has been reviewed and is understood.  Full responsibility of the confidentiality of this discharge information lies with you and/or your care-partner.

## 2023-08-23 ENCOUNTER — Other Ambulatory Visit: Payer: Self-pay

## 2023-08-23 ENCOUNTER — Telehealth: Payer: Self-pay

## 2023-08-23 DIAGNOSIS — F985 Adult onset fluency disorder: Secondary | ICD-10-CM | POA: Diagnosis not present

## 2023-08-23 DIAGNOSIS — Z862 Personal history of diseases of the blood and blood-forming organs and certain disorders involving the immune mechanism: Secondary | ICD-10-CM

## 2023-08-23 NOTE — Telephone Encounter (Signed)
 Left message

## 2023-08-27 ENCOUNTER — Other Ambulatory Visit (HOSPITAL_COMMUNITY): Payer: Self-pay | Admitting: *Deleted

## 2023-08-29 ENCOUNTER — Ambulatory Visit: Payer: Self-pay | Admitting: Gastroenterology

## 2023-08-29 ENCOUNTER — Ambulatory Visit (HOSPITAL_COMMUNITY)
Admission: RE | Admit: 2023-08-29 | Discharge: 2023-08-29 | Disposition: A | Discharge status: Home / Self Care | Source: Ambulatory Visit | Attending: Internal Medicine | Admitting: Internal Medicine

## 2023-08-29 DIAGNOSIS — M810 Age-related osteoporosis without current pathological fracture: Secondary | ICD-10-CM | POA: Diagnosis not present

## 2023-08-29 LAB — SURGICAL PATHOLOGY

## 2023-08-29 MED ORDER — ZOLEDRONIC ACID 5 MG/100ML IV SOLN
5.0000 mg | Freq: Once | INTRAVENOUS | Status: AC
  Administered 2023-08-29: 5 mg via INTRAVENOUS

## 2023-08-29 MED ORDER — ZOLEDRONIC ACID 5 MG/100ML IV SOLN
INTRAVENOUS | Status: AC
  Filled 2023-08-29: qty 100

## 2023-09-04 DIAGNOSIS — F985 Adult onset fluency disorder: Secondary | ICD-10-CM | POA: Diagnosis not present

## 2023-09-06 DIAGNOSIS — R2689 Other abnormalities of gait and mobility: Secondary | ICD-10-CM | POA: Diagnosis not present

## 2023-09-13 DIAGNOSIS — R2689 Other abnormalities of gait and mobility: Secondary | ICD-10-CM | POA: Diagnosis not present

## 2023-09-13 DIAGNOSIS — F985 Adult onset fluency disorder: Secondary | ICD-10-CM | POA: Diagnosis not present

## 2023-09-14 ENCOUNTER — Other Ambulatory Visit: Payer: Self-pay | Admitting: Internal Medicine

## 2023-09-14 DIAGNOSIS — N6489 Other specified disorders of breast: Secondary | ICD-10-CM

## 2023-09-20 ENCOUNTER — Other Ambulatory Visit: Payer: Self-pay | Admitting: Internal Medicine

## 2023-09-20 ENCOUNTER — Inpatient Hospital Stay
Admission: RE | Admit: 2023-09-20 | Discharge: 2023-09-20 | Discharge status: Home / Self Care | Source: Ambulatory Visit | Attending: Internal Medicine

## 2023-09-20 ENCOUNTER — Ambulatory Visit

## 2023-09-20 ENCOUNTER — Other Ambulatory Visit

## 2023-09-20 DIAGNOSIS — N6489 Other specified disorders of breast: Secondary | ICD-10-CM

## 2023-09-20 DIAGNOSIS — R2689 Other abnormalities of gait and mobility: Secondary | ICD-10-CM | POA: Diagnosis not present

## 2023-09-20 DIAGNOSIS — Z1231 Encounter for screening mammogram for malignant neoplasm of breast: Secondary | ICD-10-CM | POA: Diagnosis not present

## 2023-09-27 DIAGNOSIS — R2689 Other abnormalities of gait and mobility: Secondary | ICD-10-CM | POA: Diagnosis not present

## 2023-10-10 DIAGNOSIS — R2689 Other abnormalities of gait and mobility: Secondary | ICD-10-CM | POA: Diagnosis not present

## 2023-10-12 DIAGNOSIS — D2261 Melanocytic nevi of right upper limb, including shoulder: Secondary | ICD-10-CM | POA: Diagnosis not present

## 2023-10-12 DIAGNOSIS — L570 Actinic keratosis: Secondary | ICD-10-CM | POA: Diagnosis not present

## 2023-10-12 DIAGNOSIS — L821 Other seborrheic keratosis: Secondary | ICD-10-CM | POA: Diagnosis not present

## 2023-10-12 DIAGNOSIS — D2272 Melanocytic nevi of left lower limb, including hip: Secondary | ICD-10-CM | POA: Diagnosis not present

## 2023-10-12 DIAGNOSIS — L820 Inflamed seborrheic keratosis: Secondary | ICD-10-CM | POA: Diagnosis not present

## 2023-10-12 DIAGNOSIS — Z85820 Personal history of malignant melanoma of skin: Secondary | ICD-10-CM | POA: Diagnosis not present

## 2023-10-12 DIAGNOSIS — D2239 Melanocytic nevi of other parts of face: Secondary | ICD-10-CM | POA: Diagnosis not present

## 2023-10-12 DIAGNOSIS — D225 Melanocytic nevi of trunk: Secondary | ICD-10-CM | POA: Diagnosis not present

## 2023-10-12 DIAGNOSIS — D1722 Benign lipomatous neoplasm of skin and subcutaneous tissue of left arm: Secondary | ICD-10-CM | POA: Diagnosis not present

## 2023-11-13 DIAGNOSIS — F3341 Major depressive disorder, recurrent, in partial remission: Secondary | ICD-10-CM | POA: Diagnosis not present

## 2023-11-13 DIAGNOSIS — F411 Generalized anxiety disorder: Secondary | ICD-10-CM | POA: Diagnosis not present

## 2023-11-13 DIAGNOSIS — F4310 Post-traumatic stress disorder, unspecified: Secondary | ICD-10-CM | POA: Diagnosis not present

## 2023-11-16 DIAGNOSIS — R32 Unspecified urinary incontinence: Secondary | ICD-10-CM | POA: Diagnosis not present

## 2023-11-16 DIAGNOSIS — K219 Gastro-esophageal reflux disease without esophagitis: Secondary | ICD-10-CM | POA: Diagnosis not present

## 2023-11-16 DIAGNOSIS — M810 Age-related osteoporosis without current pathological fracture: Secondary | ICD-10-CM | POA: Diagnosis not present

## 2023-11-16 DIAGNOSIS — G319 Degenerative disease of nervous system, unspecified: Secondary | ICD-10-CM | POA: Diagnosis not present

## 2023-11-16 DIAGNOSIS — M1990 Unspecified osteoarthritis, unspecified site: Secondary | ICD-10-CM | POA: Diagnosis not present

## 2023-11-16 DIAGNOSIS — I129 Hypertensive chronic kidney disease with stage 1 through stage 4 chronic kidney disease, or unspecified chronic kidney disease: Secondary | ICD-10-CM | POA: Diagnosis not present

## 2023-11-16 DIAGNOSIS — N1832 Chronic kidney disease, stage 3b: Secondary | ICD-10-CM | POA: Diagnosis not present

## 2023-11-16 DIAGNOSIS — G629 Polyneuropathy, unspecified: Secondary | ICD-10-CM | POA: Diagnosis not present

## 2023-11-16 DIAGNOSIS — E669 Obesity, unspecified: Secondary | ICD-10-CM | POA: Diagnosis not present

## 2023-12-18 DIAGNOSIS — N390 Urinary tract infection, site not specified: Secondary | ICD-10-CM | POA: Diagnosis not present

## 2024-01-02 DIAGNOSIS — F4311 Post-traumatic stress disorder, acute: Secondary | ICD-10-CM | POA: Diagnosis not present
